# Patient Record
Sex: Male | Born: 1985
Health system: Southern US, Community
[De-identification: ages and names within clinical notes are randomized; demographics above are authoritative.]

## PROBLEM LIST (undated history)

## (undated) DIAGNOSIS — I10 Essential (primary) hypertension: Secondary | ICD-10-CM

## (undated) DIAGNOSIS — F1920 Other psychoactive substance dependence, uncomplicated: Secondary | ICD-10-CM

## (undated) DIAGNOSIS — F329 Major depressive disorder, single episode, unspecified: Secondary | ICD-10-CM

## (undated) DIAGNOSIS — B019 Varicella without complication: Secondary | ICD-10-CM

## (undated) DIAGNOSIS — F1110 Opioid abuse, uncomplicated: Secondary | ICD-10-CM

## (undated) DIAGNOSIS — Z87820 Personal history of traumatic brain injury: Secondary | ICD-10-CM

## (undated) DIAGNOSIS — F419 Anxiety disorder, unspecified: Secondary | ICD-10-CM

## (undated) DIAGNOSIS — K5792 Diverticulitis of intestine, part unspecified, without perforation or abscess without bleeding: Secondary | ICD-10-CM

## (undated) DIAGNOSIS — F32A Depression, unspecified: Secondary | ICD-10-CM

## (undated) HISTORY — DX: Depression, unspecified: F32.A

## (undated) HISTORY — DX: Varicella without complication: B01.9

## (undated) HISTORY — DX: Essential (primary) hypertension: I10

## (undated) HISTORY — DX: Other psychoactive substance dependence, uncomplicated: F19.20

## (undated) HISTORY — DX: Personal history of traumatic brain injury: Z87.820

## (undated) HISTORY — DX: Major depressive disorder, single episode, unspecified: F32.9

## (undated) HISTORY — PX: PARTIAL COLECTOMY: SHX5273

## (undated) HISTORY — DX: Anxiety disorder, unspecified: F41.9

---

## 2009-03-15 ENCOUNTER — Emergency Department (HOSPITAL_COMMUNITY): Admission: EM | Admit: 2009-03-15 | Discharge: 2009-03-15 | Payer: Self-pay | Admitting: Emergency Medicine

## 2012-01-09 ENCOUNTER — Emergency Department: Payer: Self-pay | Admitting: Emergency Medicine

## 2012-02-13 ENCOUNTER — Emergency Department: Payer: Self-pay | Admitting: Emergency Medicine

## 2012-02-13 LAB — URINALYSIS, COMPLETE
Bilirubin,UR: NEGATIVE
Glucose,UR: NEGATIVE mg/dL (ref 0–75)
Leukocyte Esterase: NEGATIVE
WBC UR: <1 /HPF (ref 0–5)

## 2012-02-13 LAB — BASIC METABOLIC PANEL
Calcium, Total: 8.9 mg/dL (ref 8.5–10.1)
Co2: 27 mmol/L (ref 21–32)
EGFR (African American): >60
EGFR (Non-African Amer.): >60
Glucose: 92 mg/dL (ref 65–99)
Osmolality: 286 (ref 275–301)
Potassium: 4.3 mmol/L (ref 3.5–5.1)
Sodium: 142 mmol/L (ref 136–145)

## 2012-02-13 LAB — CBC
HCT: 44.7 % (ref 40.0–52.0)
HGB: 15.1 g/dL (ref 13.0–18.0)
RBC: 4.85 10*6/uL (ref 4.40–5.90)
RDW: 13.7 % (ref 11.5–14.5)

## 2012-08-09 ENCOUNTER — Emergency Department: Payer: Self-pay | Admitting: Emergency Medicine

## 2012-08-09 LAB — URINALYSIS, COMPLETE
Blood: NEGATIVE
Glucose,UR: NEGATIVE mg/dL (ref 0–75)
Leukocyte Esterase: NEGATIVE
Nitrite: NEGATIVE
RBC,UR: 6 /HPF (ref 0–5)

## 2012-08-09 LAB — CBC
HCT: 43.2 % (ref 40.0–52.0)
HGB: 14.9 g/dL (ref 13.0–18.0)
MCH: 30.7 pg (ref 26.0–34.0)
MCHC: 34.5 g/dL (ref 32.0–36.0)
MCV: 89 fL (ref 80–100)
Platelet: 223 10*3/uL (ref 150–440)
RBC: 4.85 10*6/uL (ref 4.40–5.90)
RDW: 13.3 % (ref 11.5–14.5)

## 2012-08-09 LAB — COMPREHENSIVE METABOLIC PANEL
Anion Gap: 6 — ABNORMAL LOW (ref 7–16)
Chloride: 105 mmol/L (ref 98–107)
Co2: 28 mmol/L (ref 21–32)
Creatinine: 0.93 mg/dL (ref 0.60–1.30)
EGFR (Non-African Amer.): >60
Glucose: 97 mg/dL (ref 65–99)
Osmolality: 279 (ref 275–301)
SGOT(AST): 31 U/L (ref 15–37)
SGPT (ALT): 43 U/L (ref 12–78)
Sodium: 139 mmol/L (ref 136–145)

## 2012-08-09 LAB — LIPASE, BLOOD: Lipase: 53 U/L — ABNORMAL LOW (ref 73–393)

## 2013-05-19 ENCOUNTER — Emergency Department: Payer: Self-pay | Admitting: Emergency Medicine

## 2014-12-16 ENCOUNTER — Ambulatory Visit: Payer: Self-pay | Admitting: Family Medicine

## 2015-01-19 ENCOUNTER — Other Ambulatory Visit: Payer: Self-pay | Admitting: Unknown Physician Specialty

## 2015-01-19 ENCOUNTER — Other Ambulatory Visit: Payer: Self-pay

## 2015-01-19 NOTE — Telephone Encounter (Signed)
Patient was last seen on 06/17/14, practice partner number is [phone], and pharmacy is [place].

## 2015-01-19 NOTE — Telephone Encounter (Signed)
Needs seen

## 2015-01-20 ENCOUNTER — Other Ambulatory Visit: Payer: Self-pay | Admitting: Unknown Physician Specialty

## 2015-01-20 MED ORDER — GABAPENTIN 400 MG PO CAPS: 400.0000 mg | ORAL_CAPSULE | Freq: Three times a day (TID) | ORAL | Status: DC

## 2015-01-20 NOTE — Telephone Encounter (Signed)
Patient has appointment tomorrow

## 2015-01-21 ENCOUNTER — Ambulatory Visit: Payer: Self-pay | Admitting: Family Medicine

## 2015-01-21 ENCOUNTER — Encounter: Payer: Self-pay | Admitting: Unknown Physician Specialty

## 2015-01-21 ENCOUNTER — Ambulatory Visit (INDEPENDENT_AMBULATORY_CARE_PROVIDER_SITE_OTHER): Payer: Self-pay | Admitting: Unknown Physician Specialty

## 2015-01-21 VITALS — BP 152/100 | HR 101 | Temp 98.5°F | Ht 67.3 in | Wt 224.4 lb

## 2015-01-21 DIAGNOSIS — F1910 Other psychoactive substance abuse, uncomplicated: Secondary | ICD-10-CM

## 2015-01-21 MED ORDER — SERTRALINE HCL 50 MG PO TABS: 50.0000 mg | ORAL_TABLET | Freq: Every day | ORAL | Status: DC

## 2015-01-21 MED ORDER — GABAPENTIN 400 MG PO CAPS: 800.0000 mg | ORAL_CAPSULE | Freq: Three times a day (TID) | ORAL | Status: DC

## 2015-01-21 NOTE — Assessment & Plan Note (Addendum)
Problems with Gabapentin.  Will establish weekly contract with slow taper monthly.  New rx every week.  Start Zoloft daily.  Discussed pt with Dr. Sherie Don.  Will work with abuse counselor and see if they can recommend a psychiatrist

## 2015-01-21 NOTE — Progress Notes (Addendum)
   BP 152/100 mmHg  Pulse 101  Temp(Src) 98.5 F (36.9 C)  Ht 5' 7.3" (1.709 m)  Wt 224 lb 6.4 oz (101.787 kg)  BMI 34.85 kg/m2  SpO2 95%   Subjective:    Patient ID: Christopher Alvarez, male    DOB: April 05, 1986, 29 y.o.   MRN: [id number]  HPI: Christopher Alvarez is a 29 y.o. male  Chief Complaint  Patient presents with  . Medication Problem    pt states he feels like he is becoming dependent on gabapentin and wants to see about getting something else, states he is also trying to come off of trazodone   Pt is taking Gabapentin and needs a refill.  He does feel like he is addicted to it.  States if he doesn't have it, it feels like a "vice" around him.  He does have a history of addiction and is a member of AA and NA and sees the addiction specialists [facility].  States Gabapentin alters the way he feels and he wants to take more of anything that makes him feel better. He takes the Trazadone only on occasion.  He has taken Zoloft in the past and Strattera worked the best but unable to afford it.  He is wondering if Intuniv will help his lack of focus.    Relevant past medical, surgical, family and social history reviewed and updated as indicated. Interim medical history since our last visit reviewed. Allergies and medications reviewed and updated.  Review of Systems  Per HPI unless specifically indicated above     Objective:    BP 152/100 mmHg  Pulse 101  Temp(Src) 98.5 F (36.9 C)  Ht 5' 7.3" (1.709 m)  Wt 224 lb 6.4 oz (101.787 kg)  BMI 34.85 kg/m2  SpO2 95%  Wt Readings from Last 3 Encounters:  01/21/15 224 lb 6.4 oz (101.787 kg)  06/17/14 241 lb (109.317 kg)    Physical Exam  Constitutional: He is oriented to person, place, and time. He appears well-developed and well-nourished. No distress.  HENT:  Head: Normocephalic and atraumatic.  Eyes: Conjunctivae and lids are normal. Right eye exhibits no discharge. Left eye exhibits no discharge. No scleral icterus.   Cardiovascular: Normal rate and regular rhythm.   Pulmonary/Chest: Effort normal. No respiratory distress.  Abdominal: Normal appearance and bowel sounds are normal. He exhibits no distension. There is no splenomegaly or hepatomegaly. There is no tenderness.  Musculoskeletal: Normal range of motion.  Neurological: He is alert and oriented to person, place, and time.  Skin: Skin is intact. No rash noted. No pallor.  Psychiatric: He has a normal mood and affect. His behavior is normal. Judgment and thought content normal.  Vitals reviewed.    Assessment & Plan:   Problem List Items Addressed This Visit      Unprioritized   Substance abuse - Primary    Problems with Gabapentin.  Will establish weekly contract with slow taper monthly.  New rx every week.  Start Zoloft daily.  Discussed pt with Dr. Sherie Don.  Will work with abuse counselor and see if they can recommend a psychiatrist          Follow up plan: Return in about 4 weeks (around 02/18/2015).  Pt had Gabapentin in bottles that he brought over to dispose of.  Disposed of #180 pills in the sharps container witness by Berneice Heinrich.

## 2015-01-23 ENCOUNTER — Other Ambulatory Visit: Payer: Self-pay | Admitting: Unknown Physician Specialty

## 2015-01-23 MED ORDER — GABAPENTIN 400 MG PO CAPS: 800.0000 mg | ORAL_CAPSULE | Freq: Three times a day (TID) | ORAL | Status: DC

## 2015-01-26 ENCOUNTER — Telehealth: Payer: Self-pay | Admitting: Unknown Physician Specialty

## 2015-01-26 DIAGNOSIS — Z5181 Encounter for therapeutic drug level monitoring: Secondary | ICD-10-CM

## 2015-01-26 MED ORDER — METOPROLOL SUCCINATE ER 25 MG PO TB24: 25.0000 mg | ORAL_TABLET | Freq: Every day | ORAL | Status: DC

## 2015-01-26 NOTE — Telephone Encounter (Signed)
So do I need to call and schedule him a lab visit?

## 2015-01-26 NOTE — Telephone Encounter (Signed)
Not sure, however it works to get his labs drawn

## 2015-01-26 NOTE — Telephone Encounter (Signed)
Routing to provider  

## 2015-01-26 NOTE — Telephone Encounter (Signed)
Pt came in and said the zoloft doesn't seem to be working and he will schedule something with [facility] regarding that. He would like to have something called in for his blood pressure to [facility]. His BP is ranging from 160/105 to 150/100. The highest was 163/108.

## 2015-01-26 NOTE — Telephone Encounter (Signed)
Called patient to schedule lab appointment but he already has one scheduled for 01/28/15.

## 2015-01-26 NOTE — Telephone Encounter (Signed)
All are ordered.  Thanks

## 2015-01-26 NOTE — Telephone Encounter (Signed)
Pt also was told he should have a CBC and a CMP w Diff.  Wants to know if Elnita Maxwell will order these?

## 2015-01-28 ENCOUNTER — Other Ambulatory Visit: Payer: Self-pay

## 2015-01-28 DIAGNOSIS — Z5181 Encounter for therapeutic drug level monitoring: Secondary | ICD-10-CM

## 2015-01-29 ENCOUNTER — Encounter: Payer: Self-pay | Admitting: Unknown Physician Specialty

## 2015-01-29 LAB — CBC WITH DIFFERENTIAL/PLATELET
BASOS ABS: 0 10*3/uL (ref 0.0–0.2)
Basos: 0 %
EOS (ABSOLUTE): 0.1 10*3/uL (ref 0.0–0.4)
Eos: 1 %
Hematocrit: 47.9 % (ref 37.5–51.0)
Hemoglobin: 15.8 g/dL (ref 12.6–17.7)
IMMATURE GRANS (ABS): 0 10*3/uL (ref 0.0–0.1)
IMMATURE GRANULOCYTES: 0 %
LYMPHS: 30 %
Lymphocytes Absolute: 3.4 10*3/uL — ABNORMAL HIGH (ref 0.7–3.1)
MCH: 30.3 pg (ref 26.6–33.0)
MCHC: 33 g/dL (ref 31.5–35.7)
MCV: 92 fL (ref 79–97)
Monocytes Absolute: 0.6 10*3/uL (ref 0.1–0.9)
Monocytes: 6 %
NEUTROS PCT: 63 %
Neutrophils Absolute: 7.2 10*3/uL — ABNORMAL HIGH (ref 1.4–7.0)
PLATELETS: 285 10*3/uL (ref 150–379)
RBC: 5.22 x10E6/uL (ref 4.14–5.80)
RDW: 13.4 % (ref 12.3–15.4)
WBC: 11.4 10*3/uL — ABNORMAL HIGH (ref 3.4–10.8)

## 2015-01-29 LAB — COMPREHENSIVE METABOLIC PANEL
ALT: 20 IU/L (ref 0–44)
AST: 15 IU/L (ref 0–40)
Albumin/Globulin Ratio: 2 (ref 1.1–2.5)
Albumin: 4.6 g/dL (ref 3.5–5.5)
Alkaline Phosphatase: 54 IU/L (ref 39–117)
BILIRUBIN TOTAL: 0.3 mg/dL (ref 0.0–1.2)
BUN/Creatinine Ratio: 13 (ref 8–19)
BUN: 12 mg/dL (ref 6–20)
CALCIUM: 9.6 mg/dL (ref 8.7–10.2)
CHLORIDE: 100 mmol/L (ref 97–108)
CO2: 26 mmol/L (ref 18–29)
Creatinine, Ser: 0.96 mg/dL (ref 0.76–1.27)
GFR calc non Af Amer: 105 mL/min/{1.73_m2} (ref >59)
GFR, EST AFRICAN AMERICAN: 121 mL/min/{1.73_m2} (ref >59)
GLUCOSE: 92 mg/dL (ref 65–99)
Globulin, Total: 2.3 g/dL (ref 1.5–4.5)
Potassium: 4.9 mmol/L (ref 3.5–5.2)
Sodium: 142 mmol/L (ref 134–144)
TOTAL PROTEIN: 6.9 g/dL (ref 6.0–8.5)

## 2015-01-30 ENCOUNTER — Ambulatory Visit: Payer: Self-pay | Admitting: Family Medicine

## 2015-01-30 ENCOUNTER — Other Ambulatory Visit: Payer: Self-pay | Admitting: Unknown Physician Specialty

## 2015-01-30 MED ORDER — GABAPENTIN 400 MG PO CAPS: 800.0000 mg | ORAL_CAPSULE | Freq: Three times a day (TID) | ORAL | Status: DC

## 2015-02-06 ENCOUNTER — Other Ambulatory Visit: Payer: Self-pay | Admitting: Unknown Physician Specialty

## 2015-02-06 MED ORDER — GABAPENTIN 400 MG PO CAPS: 800.0000 mg | ORAL_CAPSULE | Freq: Three times a day (TID) | ORAL | Status: DC

## 2015-02-13 ENCOUNTER — Ambulatory Visit (INDEPENDENT_AMBULATORY_CARE_PROVIDER_SITE_OTHER): Payer: Self-pay | Admitting: Unknown Physician Specialty

## 2015-02-13 ENCOUNTER — Encounter: Payer: Self-pay | Admitting: Unknown Physician Specialty

## 2015-02-13 VITALS — BP 144/97 | HR 75 | Temp 98.1°F | Ht 67.5 in | Wt 230.2 lb

## 2015-02-13 DIAGNOSIS — F1910 Other psychoactive substance abuse, uncomplicated: Secondary | ICD-10-CM

## 2015-02-13 MED ORDER — GABAPENTIN 600 MG PO TABS: 600.0000 mg | ORAL_TABLET | Freq: Three times a day (TID) | ORAL | Status: DC

## 2015-02-13 NOTE — Assessment & Plan Note (Signed)
Continue taper down to 600 mg TID

## 2015-02-13 NOTE — Progress Notes (Signed)
   BP 144/97 mmHg  Pulse 75  Temp(Src) 98.1 F (36.7 C)  Ht 5' 7.5" (1.715 m)  Wt 230 lb 3.2 oz (104.418 kg)  BMI 35.50 kg/m2  SpO2 97%   Subjective:    Patient ID: Christopher Alvarez, male    DOB: 1985-09-27, 29 y.o.   MRN: [id number]  HPI: Christopher Alvarez is a 29 y.o. male  Chief Complaint  Patient presents with  . Follow-up    pt was seen 4 weeks ago for substnace abuse. States he is a recovering addict. Also states he stopped taking Zoloft because it made him feel worse.   Pt is now tapering down on Gabapentin.  He has an appointment with [facility].  He feels Gabapentin is working for mental health but needs to be on something else.   Last picked up prescription for Gabapentin yesterday morning.  I will write a prescription for 600 mg TID I will check back in 3 months.  We will continue the taper every month by one pill  Relevant past medical, surgical, family and social history reviewed and updated as indicated. Interim medical history since our last visit reviewed. Allergies and medications reviewed and updated.  Review of Systems  Per HPI unless specifically indicated above     Objective:    BP 144/97 mmHg  Pulse 75  Temp(Src) 98.1 F (36.7 C)  Ht 5' 7.5" (1.715 m)  Wt 230 lb 3.2 oz (104.418 kg)  BMI 35.50 kg/m2  SpO2 97%  Wt Readings from Last 3 Encounters:  02/13/15 230 lb 3.2 oz (104.418 kg)  01/21/15 224 lb 6.4 oz (101.787 kg)  06/17/14 241 lb (109.317 kg)    Physical Exam  Constitutional: He is oriented to person, place, and time. He appears well-developed and well-nourished. No distress.  HENT:  Head: Normocephalic and atraumatic.  Eyes: Conjunctivae and lids are normal. Right eye exhibits no discharge. Left eye exhibits no discharge. No scleral icterus.  Cardiovascular: Normal rate, regular rhythm and normal heart sounds.   Pulmonary/Chest: Effort normal and breath sounds normal. No respiratory distress.  Abdominal: Normal appearance. There is no  splenomegaly or hepatomegaly.  Musculoskeletal: Normal range of motion.  Neurological: He is alert and oriented to person, place, and time.  Skin: Skin is intact. No rash noted. No pallor.  Psychiatric: He has a normal mood and affect. His behavior is normal. Judgment and thought content normal.   Assessment & Plan:   Problem List Items Addressed This Visit      Unprioritized   Substance abuse - Primary    Continue taper down to 600 mg TID          Follow up plan: Return in about 3 months (around 05/15/2015).

## 2015-02-15 ENCOUNTER — Telehealth: Payer: Self-pay | Admitting: Family Medicine

## 2015-02-15 MED ORDER — AMLODIPINE BESYLATE 10 MG PO TABS: 10.0000 mg | ORAL_TABLET | Freq: Every day | ORAL | Status: DC

## 2015-02-15 NOTE — Telephone Encounter (Signed)
I spoke with patient after after-hours nurse talked with me about him High blood pressure; she advised urgent care, f/u with doctor in 24 hours; he refused, can't afford it, wants med called in I explained my official recommendation is that he go to urgent care, but he just can't can't he says He denies chest pain; has slight headache; no other neurologic signs/symptoms He wants medicine; add amlodipine to beta-blocker; start today, sent to [facility] F/U in office tomorrow for BP check (no charge)

## 2015-02-16 ENCOUNTER — Emergency Department: Payer: Self-pay

## 2015-02-16 ENCOUNTER — Other Ambulatory Visit: Payer: Self-pay

## 2015-02-16 ENCOUNTER — Ambulatory Visit (INDEPENDENT_AMBULATORY_CARE_PROVIDER_SITE_OTHER): Payer: Self-pay

## 2015-02-16 ENCOUNTER — Emergency Department
Admission: EM | Admit: 2015-02-16 | Discharge: 2015-02-16 | Disposition: A | Discharge status: Home / Self Care | Payer: Self-pay | Attending: Emergency Medicine | Admitting: Emergency Medicine

## 2015-02-16 ENCOUNTER — Encounter: Payer: Self-pay | Admitting: Emergency Medicine

## 2015-02-16 VITALS — BP 125/85 | HR 87

## 2015-02-16 DIAGNOSIS — Z720 Tobacco use: Secondary | ICD-10-CM | POA: Insufficient documentation

## 2015-02-16 DIAGNOSIS — R079 Chest pain, unspecified: Secondary | ICD-10-CM

## 2015-02-16 DIAGNOSIS — Z79899 Other long term (current) drug therapy: Secondary | ICD-10-CM | POA: Insufficient documentation

## 2015-02-16 DIAGNOSIS — Z0130 Encounter for examination of blood pressure without abnormal findings: Secondary | ICD-10-CM

## 2015-02-16 DIAGNOSIS — I10 Essential (primary) hypertension: Secondary | ICD-10-CM | POA: Insufficient documentation

## 2015-02-16 DIAGNOSIS — Z136 Encounter for screening for cardiovascular disorders: Secondary | ICD-10-CM

## 2015-02-16 LAB — TROPONIN I

## 2015-02-16 LAB — COMPREHENSIVE METABOLIC PANEL
ALBUMIN: 4.6 g/dL (ref 3.5–5.0)
ALT: 21 U/L (ref 17–63)
ANION GAP: 9 (ref 5–15)
AST: 23 U/L (ref 15–41)
Alkaline Phosphatase: 49 U/L (ref 38–126)
BUN: 16 mg/dL (ref 6–20)
CHLORIDE: 101 mmol/L (ref 101–111)
CO2: 28 mmol/L (ref 22–32)
Calcium: 9.4 mg/dL (ref 8.9–10.3)
Creatinine, Ser: 1.05 mg/dL (ref 0.61–1.24)
GFR calc Af Amer: >60 mL/min (ref >60)
Glucose, Bld: 95 mg/dL (ref 65–99)
POTASSIUM: 4.2 mmol/L (ref 3.5–5.1)
Sodium: 138 mmol/L (ref 135–145)
Total Bilirubin: 0.8 mg/dL (ref 0.3–1.2)
Total Protein: 7.8 g/dL (ref 6.5–8.1)

## 2015-02-16 LAB — CBC WITH DIFFERENTIAL/PLATELET
BASOS ABS: 0.1 10*3/uL (ref 0–0.1)
BASOS PCT: 1 %
EOS PCT: 1 %
Eosinophils Absolute: 0.1 10*3/uL (ref 0–0.7)
HCT: 49 % (ref 40.0–52.0)
Hemoglobin: 16.5 g/dL (ref 13.0–18.0)
Lymphocytes Relative: 22 %
Lymphs Abs: 2.9 10*3/uL (ref 1.0–3.6)
MCH: 30.5 pg (ref 26.0–34.0)
MCHC: 33.7 g/dL (ref 32.0–36.0)
MCV: 90.5 fL (ref 80.0–100.0)
MONO ABS: 1 10*3/uL (ref 0.2–1.0)
Monocytes Relative: 8 %
Neutro Abs: 9.2 10*3/uL — ABNORMAL HIGH (ref 1.4–6.5)
Neutrophils Relative %: 68 %
PLATELETS: 240 10*3/uL (ref 150–440)
RBC: 5.41 MIL/uL (ref 4.40–5.90)
RDW: 13.3 % (ref 11.5–14.5)
WBC: 13.2 10*3/uL — ABNORMAL HIGH (ref 3.8–10.6)

## 2015-02-16 LAB — FIBRIN DERIVATIVES D-DIMER (ARMC ONLY): Fibrin derivatives D-dimer (ARMC): 160 (ref 0–499)

## 2015-02-16 MED ORDER — LORAZEPAM 1 MG PO TABS
1.0000 mg | ORAL_TABLET | Freq: Once | ORAL | Status: AC
  Administered 2015-02-16: 1 mg via ORAL
  Filled 2015-02-16: qty 1

## 2015-02-16 MED ORDER — ACETAMINOPHEN 500 MG PO TABS
1000.0000 mg | ORAL_TABLET | Freq: Once | ORAL | Status: AC
  Administered 2015-02-16: 1000 mg via ORAL
  Filled 2015-02-16: qty 2

## 2015-02-16 NOTE — ED Notes (Signed)
Pt states his BP has been in the 180s SBP, pt states he was given norvasc yesterday and took his first dose last night and then his 2nd dose with metoprolol this AM, pt now states chest pain on the right side, radiating to his neck and jaw, pt denies any SOB or nausea

## 2015-02-16 NOTE — Discharge Instructions (Signed)

## 2015-02-16 NOTE — ED Provider Notes (Addendum)
[facility] Emergency Department Provider Note  ____________________________________________  Time seen: Approximately 445 PM  I have reviewed the triage vital signs and the nursing notes.   HISTORY  Chief Complaint Chest Pain    HPI Christopher Alvarez is a 29 y.o. male with a history of hypertension and anxiety who is been experiencing chest pain since 12 PM today. He says that the chest pain last anywhere from several seconds to several minutes and is changing positions on his body. He says that it will be on the left and in the right side of his chest. He says that he also feels anxious along with the chest pain. He has had elevated blood pressure over the past 2 days and just started new medications this morning. He says that his blood pressure was up into the 180s over 1 teens. He denies any chest pain at this time. No shortness of breath. Denies any nausea or vomiting. Denies any diaphoresis. No history of cardiac disease in his family. He is a smoker. No radiation of the pain.No pain with deep inspiration.   Past Medical History  Diagnosis Date  . Hypertension     There are no active problems to display for this patient.   History reviewed. No pertinent past surgical history.  Current Outpatient Rx  Name  Route  Sig  Dispense  Refill  . gabapentin (NEURONTIN) 400 MG capsule   Oral   Take 2 capsules (800 mg total) by mouth 3 (three) times daily. Take 2 capsules 3 times daily   42 capsule   0     Allergies Review of patient's allergies indicates no known allergies.  History reviewed. No pertinent family history.  Social History Social History  Substance Use Topics  . Smoking status: Current Every Day Smoker    Types: Cigarettes  . Smokeless tobacco: None  . Alcohol Use: No    Review of Systems Constitutional: No fever/chills Eyes: No visual changes. ENT: No sore throat. Cardiovascular: As above  Respiratory: Denies shortness of  breath. Gastrointestinal: No abdominal pain.  No nausea, no vomiting.  No diarrhea.  No constipation. Genitourinary: Negative for dysuria. Musculoskeletal: Negative for back pain. Skin: Negative for rash. Neurological: Negative for headaches, focal weakness or numbness.  10-point ROS otherwise negative.  ____________________________________________   PHYSICAL EXAM:  VITAL SIGNS: ED Triage Vitals  Enc Vitals Group     BP 02/16/15 1521 144/87 mmHg     Pulse Rate 02/16/15 1521 100     Resp 02/16/15 1521 20     Temp 02/16/15 1521 98.6 F (37 C)     Temp Source 02/16/15 1521 Oral     SpO2 --      Weight 02/16/15 1522 230 lb (104.327 kg)     Height 02/16/15 1521  (1.753 m)     Head Cir --      Peak Flow --      Pain Score --      Pain Loc --      Pain Edu? --      Excl. in GC? --     Constitutional: Alert and oriented. Well appearing and in no acute distress. Eyes: Conjunctivae are normal. PERRL. EOMI. Head: Atraumatic. Nose: No congestion/rhinnorhea. Mouth/Throat: Mucous membranes are moist.  Oropharynx non-erythematous. Neck: No stridor.   Cardiovascular: Normal rate, regular rhythm. Grossly normal heart sounds.  Good peripheral circulation. Chest pain is not reproducible to palpation. Respiratory: Normal respiratory effort.  No retractions. Lungs CTAB. Gastrointestinal:  Soft and nontender. No distention. No abdominal bruits. No CVA tenderness. Musculoskeletal: No lower extremity tenderness nor edema.  No joint effusions. Neurologic:  Normal speech and language. No gross focal neurologic deficits are appreciated. No gait instability. Skin:  Skin is warm, dry and intact. No rash noted. Psychiatric: Mood and affect are normal. Speech and behavior are normal.  ____________________________________________   LABS (all labs ordered are listed, but only abnormal results are displayed)  Labs Reviewed  CBC WITH DIFFERENTIAL/PLATELET - Abnormal; Notable for the following:     WBC 13.2 (*)    Neutro Abs 9.2 (*)    All other components within normal limits  COMPREHENSIVE METABOLIC PANEL  TROPONIN I  FIBRIN DERIVATIVES D-DIMER ([facility] ONLY)  TROPONIN I   ____________________________________________  EKG  ED ECG REPORT I, Arelia Longest, the attending physician, personally viewed and interpreted this ECG.   Date: 02/16/2015  EKG Time: 1519  Rate: 103  Rhythm: sinus tachycardia  Axis: Left axis deviation  Intervals:none  ST&T Change: No ST elevations or depressions. No abnormal T-wave inversions.  ____________________________________________  RADIOLOGY  No active cardiopulmonary disease on chest x-ray. I personally reviewed these images. ____________________________________________   PROCEDURES    ____________________________________________   INITIAL IMPRESSION / ASSESSMENT AND PLAN / ED COURSE  Pertinent labs & imaging results that were available during my care of the patient were reviewed by me and considered in my medical decision making (see chart for details).  ----------------------------------------- 7:31 PM on 02/16/2015 -----------------------------------------  Patient resting comfortably at this time and continues to be chest pain-free. We'll discharge to home. Will follow-up with his primary care doctor for blood pressure recheck. Says has been feeling very anxious throughout the day. AcipHex of the pain are very atypical such as the pain changing in location. Not convinced that this is cardiac related. Possible relation to the patient's anxiety. Heart score of 1. ____________________________________________   FINAL CLINICAL IMPRESSION(S) / ED DIAGNOSES  Acute chest pain. Initial visit.    Myrna Blazer, MD 02/16/15 1932  Patient not complaining of headache and increased anxiety. We'll give Tylenol and Ativan.  Myrna Blazer, MD 02/16/15 2025

## 2015-02-16 NOTE — ED Notes (Signed)
Attempted to call lab about D-Dimer results. Lab did not answer phones. Will call again soon.

## 2015-02-16 NOTE — ED Notes (Signed)
bp high x 2 wks. , cp onset today.  Skin w/d on arrival. Added new bp med yesterday

## 2015-02-16 NOTE — ED Notes (Signed)
Spoke to lab concerning D-dimer, lab states "We are working on it right now", will follow up

## 2015-02-16 NOTE — Patient Instructions (Signed)
Per Dr. Marlise Eves request, patient came in for BP check BP: 125/85 P: 87

## 2015-02-18 ENCOUNTER — Telehealth: Payer: Self-pay

## 2015-02-18 MED ORDER — METOPROLOL SUCCINATE ER 50 MG PO TB24: 50.0000 mg | ORAL_TABLET | Freq: Every day | ORAL | Status: DC

## 2015-02-18 NOTE — Telephone Encounter (Signed)
Called and spoke with patient and advised Dr. Marlise Eves recommendations as noted/advised.

## 2015-02-18 NOTE — Telephone Encounter (Signed)
Patient came into office stating that his BP is continuing to go up at night.  Patient contacted the nurse at [facility] last night and was advised to contact our office for additional medication.  Please call patient to advise next steps.

## 2015-02-18 NOTE — Telephone Encounter (Signed)
He went to the ER; he has two accounts in Epic; he has an upcoming appt already on 02/20/15 He is taking amlodipine prescribed by me over the weekend; he should be taking medicine from [place] 25 mg metoprolol He thinks he needs another medication and wondered about getting clonidine NO clonidine on beta-blocker I recommend that he increase metoprolol from 25 mg to 50 mg daily and keep appt with Elnita Maxwell Appt here sooner or urgent care if needed; to ER for any chest pain or severe headache

## 2015-02-23 ENCOUNTER — Encounter: Payer: Self-pay | Admitting: Unknown Physician Specialty

## 2015-02-23 ENCOUNTER — Ambulatory Visit (INDEPENDENT_AMBULATORY_CARE_PROVIDER_SITE_OTHER): Payer: Self-pay | Admitting: Unknown Physician Specialty

## 2015-02-23 VITALS — BP 125/81 | HR 88 | Temp 98.5°F | Ht 68.9 in | Wt 238.8 lb

## 2015-02-23 DIAGNOSIS — F419 Anxiety disorder, unspecified: Secondary | ICD-10-CM

## 2015-02-23 MED ORDER — GABAPENTIN 600 MG PO TABS: 600.0000 mg | ORAL_TABLET | Freq: Three times a day (TID) | ORAL | Status: DC

## 2015-02-23 MED ORDER — CLONAZEPAM 0.5 MG PO TABS
0.5000 mg | ORAL_TABLET | Freq: Two times a day (BID) | ORAL | Status: DC | PRN
Start: 2015-02-23 — End: 2015-04-17

## 2015-02-23 NOTE — Assessment & Plan Note (Addendum)
?   Bipolar.  Pt is self pay and unable to afford atypical anti-psychotics.  Will rx a limited dose of Clonazepam .5 mg bid prn #20 pills until he can get to [facility] for his appointment.  Will refill his Gabapentin

## 2015-02-23 NOTE — Progress Notes (Signed)
BP 125/81 mmHg  Pulse 88  Temp(Src) 98.5 F (36.9 C)  Ht 5' 8.9" (1.75 m)  Wt 238 lb 12.8 oz (108.319 kg)  BMI 35.37 kg/m2  SpO2 96%   Subjective:    Patient ID: Christopher Alvarez, male    DOB: 06-12-1985, 29 y.o.   MRN: [id number]  HPI: Christopher Alvarez is a 29 y.o. male  Chief Complaint  Patient presents with  . Hypertension    pt states blood pressure has been getting high at night, went to hospital for BP and chest pain last week  . Anxiety    pt states he cannot sit still. States he has been having anxiety attacks frequently lately   Pt is having a lot of anxiety.  He is unable to process thoughts, his BP goes up, and gets irritable quickly.  His GAD score is 19.  Zoloft makes things 10 times worse.  He admits to being diagnosed with bipolar disorder when he was 14 or 15.  Went to the ER last week which was a "bad week."  He took one pill and he did improve immediately.  He tried to call [facility] where he is an active patient but is unable to "get through."  MDQ with 5 yes answers.    Hypertension: Taking Metoprolol and Amlodipine for his BP.  He does feel at times his Amlodipine makes him "crash"  No SOB or chest pain.  He is going to try to exercise.    Relevant past medical, surgical, family and social history reviewed and updated as indicated. Interim medical history since our last visit reviewed. Allergies and medications reviewed and updated.  Review of Systems  All other systems reviewed and are negative.   Per HPI unless specifically indicated above     Objective:    BP 125/81 mmHg  Pulse 88  Temp(Src) 98.5 F (36.9 C)  Ht 5' 8.9" (1.75 m)  Wt 238 lb 12.8 oz (108.319 kg)  BMI 35.37 kg/m2  SpO2 96%  Wt Readings from Last 3 Encounters:  02/23/15 238 lb 12.8 oz (108.319 kg)  02/13/15 230 lb 3.2 oz (104.418 kg)  01/21/15 224 lb 6.4 oz (101.787 kg)    Physical Exam  Constitutional: He is oriented to person, place, and time. He appears well-developed and  well-nourished. No distress.  HENT:  Head: Normocephalic and atraumatic.  Eyes: Conjunctivae and lids are normal. Right eye exhibits no discharge. Left eye exhibits no discharge. No scleral icterus.  Cardiovascular: Normal rate, regular rhythm and normal heart sounds.   Pulmonary/Chest: Effort normal and breath sounds normal. No respiratory distress.  Abdominal: Normal appearance. He exhibits no distension. There is no splenomegaly or hepatomegaly.  Musculoskeletal: Normal range of motion.  Neurological: He is alert and oriented to person, place, and time.  Skin: Skin is warm, dry and intact. No rash noted. No pallor.  Psychiatric: He has a normal mood and affect. His behavior is normal. Judgment and thought content normal.  Nursing note and vitals reviewed.   Results for orders placed or performed in visit on 01/28/15  CBC with Differential/Platelet  Result Value Ref Range   WBC 11.4 (H) 3.4 - 10.8 x10E3/uL   RBC 5.22 4.14 - 5.80 x10E6/uL   Hemoglobin 15.8 12.6 - 17.7 g/dL   Hematocrit 14.7 82.9 - 51.0 %   MCV 92 79 - 97 fL   MCH 30.3 26.6 - 33.0 pg   MCHC 33.0 31.5 - 35.7 g/dL   RDW 13.4  12.3 - 15.4 %   Platelets 285 150 - 379 x10E3/uL   Neutrophils 63 %   Lymphs 30 %   Monocytes 6 %   Eos 1 %   Basos 0 %   Neutrophils Absolute 7.2 (H) 1.4 - 7.0 x10E3/uL   Lymphocytes Absolute 3.4 (H) 0.7 - 3.1 x10E3/uL   Monocytes Absolute 0.6 0.1 - 0.9 x10E3/uL   EOS (ABSOLUTE) 0.1 0.0 - 0.4 x10E3/uL   Basophils Absolute 0.0 0.0 - 0.2 x10E3/uL   Immature Granulocytes 0 %   Immature Grans (Abs) 0.0 0.0 - 0.1 x10E3/uL  Comprehensive metabolic panel  Result Value Ref Range   Glucose 92 65 - 99 mg/dL   BUN 12 6 - 20 mg/dL   Creatinine, Ser 1.61 0.76 - 1.27 mg/dL   GFR calc non Af Amer 105 >59 mL/min/1.73   GFR calc Af Amer 121 >59 mL/min/1.73   BUN/Creatinine Ratio 13 8 - 19   Sodium 142 134 - 144 mmol/L   Potassium 4.9 3.5 - 5.2 mmol/L   Chloride 100 97 - 108 mmol/L   CO2 26 18 - 29  mmol/L   Calcium 9.6 8.7 - 10.2 mg/dL   Total Protein 6.9 6.0 - 8.5 g/dL   Albumin 4.6 3.5 - 5.5 g/dL   Globulin, Total 2.3 1.5 - 4.5 g/dL   Albumin/Globulin Ratio 2.0 1.1 - 2.5   Bilirubin Total 0.3 0.0 - 1.2 mg/dL   Alkaline Phosphatase 54 39 - 117 IU/L   AST 15 0 - 40 IU/L   ALT 20 0 - 44 IU/L      Assessment & Plan:   Problem List Items Addressed This Visit      Unprioritized   Chronic anxiety - Primary    ? Bipolar.  Pt is self pay and unable to afford atypical anti-psychotics.  Will rx a limited dose of Clonazepam .5 mg bid prn #20 pills until he can get to [facility] for his appointment.  Will refill his Gabapentin          Follow up plan: Return in about 4 weeks (around 03/23/2015).

## 2015-02-27 ENCOUNTER — Other Ambulatory Visit: Payer: Self-pay | Admitting: Unknown Physician Specialty

## 2015-02-27 MED ORDER — GABAPENTIN 600 MG PO TABS: 600.0000 mg | ORAL_TABLET | Freq: Three times a day (TID) | ORAL | Status: DC

## 2015-03-06 ENCOUNTER — Other Ambulatory Visit: Payer: Self-pay | Admitting: Unknown Physician Specialty

## 2015-03-06 MED ORDER — GABAPENTIN 600 MG PO TABS: 600.0000 mg | ORAL_TABLET | Freq: Three times a day (TID) | ORAL | Status: DC

## 2015-03-18 ENCOUNTER — Other Ambulatory Visit: Payer: Self-pay | Admitting: Unknown Physician Specialty

## 2015-03-18 MED ORDER — GABAPENTIN 600 MG PO TABS: 600.0000 mg | ORAL_TABLET | Freq: Three times a day (TID) | ORAL | Status: DC

## 2015-03-18 MED ORDER — GABAPENTIN 400 MG PO CAPS: 800.0000 mg | ORAL_CAPSULE | Freq: Three times a day (TID) | ORAL | Status: DC

## 2015-03-20 ENCOUNTER — Other Ambulatory Visit: Payer: Self-pay | Admitting: Unknown Physician Specialty

## 2015-03-20 ENCOUNTER — Telehealth: Payer: Self-pay | Admitting: Unknown Physician Specialty

## 2015-03-20 MED ORDER — GABAPENTIN 600 MG PO TABS: 600.0000 mg | ORAL_TABLET | Freq: Three times a day (TID) | ORAL | Status: DC

## 2015-03-20 NOTE — Telephone Encounter (Signed)
Need his prescription for October 26, written for 2 weeks please.

## 2015-04-13 ENCOUNTER — Other Ambulatory Visit: Payer: Self-pay | Admitting: Unknown Physician Specialty

## 2015-04-13 MED ORDER — GABAPENTIN 600 MG PO TABS: 600.0000 mg | ORAL_TABLET | Freq: Three times a day (TID) | ORAL | Status: DC

## 2015-04-15 ENCOUNTER — Ambulatory Visit: Payer: Self-pay | Admitting: Unknown Physician Specialty

## 2015-04-17 ENCOUNTER — Encounter: Payer: Self-pay | Admitting: Unknown Physician Specialty

## 2015-04-17 ENCOUNTER — Ambulatory Visit (INDEPENDENT_AMBULATORY_CARE_PROVIDER_SITE_OTHER): Payer: Self-pay | Admitting: Unknown Physician Specialty

## 2015-04-17 VITALS — BP 128/82 | HR 63 | Temp 99.0°F | Ht 68.2 in | Wt 223.2 lb

## 2015-04-17 DIAGNOSIS — F1910 Other psychoactive substance abuse, uncomplicated: Secondary | ICD-10-CM

## 2015-04-17 DIAGNOSIS — F902 Attention-deficit hyperactivity disorder, combined type: Secondary | ICD-10-CM

## 2015-04-17 MED ORDER — ATOMOXETINE HCL 40 MG PO CAPS: 40.0000 mg | ORAL_CAPSULE | Freq: Every day | ORAL | Status: DC

## 2015-04-17 NOTE — Assessment & Plan Note (Signed)
Start Strattera at 40 mg for 3 days and increase to 80 mg.

## 2015-04-17 NOTE — Progress Notes (Signed)
BP 128/82 mmHg  Pulse 63  Temp(Src) 99 F (37.2 C)  Ht 5' 8.2" (1.732 m)  Wt 223 lb 3.2 oz (101.243 kg)  BMI 33.75 kg/m2  SpO2 96%   Subjective:    Patient ID: Christopher Alvarez, male    DOB: May 01, 1986, 29 y.o.   MRN: [id number]  HPI: Christopher Alvarez is a 29 y.o. male  Chief Complaint  Patient presents with  . Medication Problem    pt states he would like to get on straterra   Pt would like to be on Strattera that he was on in the past.  He was diagnosed with ADHD since a child.  He would like to taper his Gabapentin a little more.  Not going to counseling with [facility] but has had difficulty and is looking for another patient.  He needs to stay off controlled substances.  He lacks ability to focus.  He has trouble filtering noise   Relevant past medical, surgical, family and social history reviewed and updated as indicated. Interim medical history since our last visit reviewed. Allergies and medications reviewed and updated.  Review of Systems  Per HPI unless specifically indicated above     Objective:    BP 128/82 mmHg  Pulse 63  Temp(Src) 99 F (37.2 C)  Ht 5' 8.2" (1.732 m)  Wt 223 lb 3.2 oz (101.243 kg)  BMI 33.75 kg/m2  SpO2 96%  Wt Readings from Last 3 Encounters:  04/17/15 223 lb 3.2 oz (101.243 kg)  02/23/15 238 lb 12.8 oz (108.319 kg)  02/13/15 230 lb 3.2 oz (104.418 kg)    Physical Exam  Constitutional: He is oriented to person, place, and time. He appears well-developed and well-nourished. No distress.  HENT:  Head: Normocephalic and atraumatic.  Eyes: Conjunctivae and lids are normal. Right eye exhibits no discharge. Left eye exhibits no discharge. No scleral icterus.  Cardiovascular: Normal rate, regular rhythm and normal heart sounds.   Pulmonary/Chest: Effort normal and breath sounds normal. No respiratory distress.  Abdominal: Normal appearance and bowel sounds are normal. He exhibits no distension. There is no splenomegaly or hepatomegaly. There is  no tenderness.  Musculoskeletal: Normal range of motion.  Neurological: He is alert and oriented to person, place, and time.  Skin: Skin is intact. No rash noted. No pallor.  Psychiatric: He has a normal mood and affect. His behavior is normal. Judgment and thought content normal.    Results for orders placed or performed in visit on 01/28/15  CBC with Differential/Platelet  Result Value Ref Range   WBC 11.4 (H) 3.4 - 10.8 x10E3/uL   RBC 5.22 4.14 - 5.80 x10E6/uL   Hemoglobin 15.8 12.6 - 17.7 g/dL   Hematocrit 40.9 81.1 - 51.0 %   MCV 92 79 - 97 fL   MCH 30.3 26.6 - 33.0 pg   MCHC 33.0 31.5 - 35.7 g/dL   RDW 91.4 78.2 - 95.6 %   Platelets 285 150 - 379 x10E3/uL   Neutrophils 63 %   Lymphs 30 %   Monocytes 6 %   Eos 1 %   Basos 0 %   Neutrophils Absolute 7.2 (H) 1.4 - 7.0 x10E3/uL   Lymphocytes Absolute 3.4 (H) 0.7 - 3.1 x10E3/uL   Monocytes Absolute 0.6 0.1 - 0.9 x10E3/uL   EOS (ABSOLUTE) 0.1 0.0 - 0.4 x10E3/uL   Basophils Absolute 0.0 0.0 - 0.2 x10E3/uL   Immature Granulocytes 0 %   Immature Grans (Abs) 0.0 0.0 - 0.1 x10E3/uL  Comprehensive metabolic panel  Result Value Ref Range   Glucose 92 65 - 99 mg/dL   BUN 12 6 - 20 mg/dL   Creatinine, Ser 1.610.96 0.76 - 1.27 mg/dL   GFR calc non Af Amer 105 >59 mL/min/1.73   GFR calc Af Amer 121 >59 mL/min/1.73   BUN/Creatinine Ratio 13 8 - 19   Sodium 142 134 - 144 mmol/L   Potassium 4.9 3.5 - 5.2 mmol/L   Chloride 100 97 - 108 mmol/L   CO2 26 18 - 29 mmol/L   Calcium 9.6 8.7 - 10.2 mg/dL   Total Protein 6.9 6.0 - 8.5 g/dL   Albumin 4.6 3.5 - 5.5 g/dL   Globulin, Total 2.3 1.5 - 4.5 g/dL   Albumin/Globulin Ratio 2.0 1.1 - 2.5   Bilirubin Total 0.3 0.0 - 1.2 mg/dL   Alkaline Phosphatase 54 39 - 117 IU/L   AST 15 0 - 40 IU/L   ALT 20 0 - 44 IU/L      Assessment & Plan:   Problem List Items Addressed This Visit      Unprioritized   Substance abuse    Decrease Gabapentin to 400 mg at next refill      Attention deficit  hyperactivity disorder (ADHD), combined type - Primary    Start Strattera at 40 mg for 3 days and increase to 80 mg.            Follow up plan: Return in about 4 weeks (around 05/15/2015).

## 2015-04-17 NOTE — Assessment & Plan Note (Signed)
Decrease Gabapentin to 400 mg at next refill

## 2015-04-24 ENCOUNTER — Other Ambulatory Visit: Payer: Self-pay | Admitting: Unknown Physician Specialty

## 2015-04-24 MED ORDER — GABAPENTIN 600 MG PO TABS: 600.0000 mg | ORAL_TABLET | Freq: Three times a day (TID) | ORAL | Status: DC

## 2015-04-28 ENCOUNTER — Other Ambulatory Visit: Payer: Self-pay | Admitting: Unknown Physician Specialty

## 2015-04-28 MED ORDER — GABAPENTIN 400 MG PO CAPS: 800.0000 mg | ORAL_CAPSULE | Freq: Three times a day (TID) | ORAL | Status: DC

## 2015-05-06 NOTE — Telephone Encounter (Signed)
done

## 2015-05-15 ENCOUNTER — Ambulatory Visit: Payer: Self-pay | Admitting: Unknown Physician Specialty

## 2015-05-18 ENCOUNTER — Other Ambulatory Visit: Payer: Self-pay | Admitting: Unknown Physician Specialty

## 2015-05-18 MED ORDER — GABAPENTIN 400 MG PO CAPS: 800.0000 mg | ORAL_CAPSULE | Freq: Three times a day (TID) | ORAL | Status: DC

## 2015-05-27 ENCOUNTER — Other Ambulatory Visit: Payer: Self-pay

## 2015-05-27 DIAGNOSIS — Z5181 Encounter for therapeutic drug level monitoring: Secondary | ICD-10-CM

## 2015-05-28 ENCOUNTER — Encounter: Payer: Self-pay | Admitting: Family Medicine

## 2015-05-28 LAB — URINE DRUGS OF ABUSE SCREEN W ALC, ROUTINE (REF LAB)
Amphetamines, Urine: NEGATIVE ng/mL
BARBITURATE QUANT UR: NEGATIVE ng/mL
BENZODIAZEPINE QUANT UR: NEGATIVE ng/mL
COCAINE (METAB.): NEGATIVE ng/mL
Cannabinoid Quant, Ur: NEGATIVE ng/mL
Ethanol U, Quan: NEGATIVE %
Methadone Screen, Urine: NEGATIVE ng/mL
OPIATE QUANT UR: NEGATIVE ng/mL
PCP QUANT UR: NEGATIVE ng/mL
Propoxyphene: NEGATIVE ng/mL

## 2015-06-05 ENCOUNTER — Other Ambulatory Visit: Payer: Self-pay | Admitting: Unknown Physician Specialty

## 2015-06-05 ENCOUNTER — Ambulatory Visit: Payer: Self-pay | Admitting: Unknown Physician Specialty

## 2015-06-05 MED ORDER — GABAPENTIN 600 MG PO TABS: 600.0000 mg | ORAL_TABLET | Freq: Three times a day (TID) | ORAL | Status: DC

## 2015-06-19 ENCOUNTER — Other Ambulatory Visit: Payer: Self-pay | Admitting: Unknown Physician Specialty

## 2015-06-19 ENCOUNTER — Ambulatory Visit (INDEPENDENT_AMBULATORY_CARE_PROVIDER_SITE_OTHER): Payer: Self-pay | Admitting: Unknown Physician Specialty

## 2015-06-19 ENCOUNTER — Encounter: Payer: Self-pay | Admitting: Unknown Physician Specialty

## 2015-06-19 VITALS — BP 129/83 | HR 87 | Temp 98.3°F | Ht 67.5 in | Wt 217.2 lb

## 2015-06-19 DIAGNOSIS — F419 Anxiety disorder, unspecified: Secondary | ICD-10-CM

## 2015-06-19 MED ORDER — GABAPENTIN 400 MG PO CAPS: 400.0000 mg | ORAL_CAPSULE | Freq: Three times a day (TID) | ORAL | Status: DC

## 2015-06-19 MED ORDER — GABAPENTIN 400 MG PO CAPS: 800.0000 mg | ORAL_CAPSULE | Freq: Three times a day (TID) | ORAL | Status: DC

## 2015-06-19 MED ORDER — CLONAZEPAM 1 MG PO TABS: 1.0000 mg | ORAL_TABLET | Freq: Two times a day (BID) | ORAL | Status: DC | PRN

## 2015-06-19 NOTE — Assessment & Plan Note (Signed)
Pt trying to get off Gabapentin..  Will cut back to 400 mg TID and start Clonazepam 1 mg twice a day until off Gabapentin.  Will then taper Clonazepam.

## 2015-06-19 NOTE — Progress Notes (Signed)
   BP 129/83 mmHg  Pulse 87  Temp(Src) 98.3 F (36.8 C)  Ht 5' 7.5" (1.715 m)  Wt 217 lb 3.2 oz (98.521 kg)  BMI 33.50 kg/m2  SpO2 95%   Subjective:    Patient ID: Christopher Alvarez, male    DOB: 01-16-1986, 30 y.o.   MRN: [id number]  HPI: Christopher Alvarez is a 30 y.o. male  Chief Complaint  Patient presents with  . Anxiety  . Hypertension   Anxiety is a problem.  Gabapentin not working but if he tries to cut down on the Gabapentin the anxiety is a problem.  He is waiting for Strattera.  He has no insurance but is getting it.  He would still like to cut out the Gabapentin. In the past he has been able to take Clonazepam and then taper off that.       Relevant past medical, surgical, family and social history reviewed and updated as indicated. Interim medical history since our last visit reviewed. Allergies and medications reviewed and updated.  Review of Systems  Per HPI unless specifically indicated above     Objective:    BP 129/83 mmHg  Pulse 87  Temp(Src) 98.3 F (36.8 C)  Ht 5' 7.5" (1.715 m)  Wt 217 lb 3.2 oz (98.521 kg)  BMI 33.50 kg/m2  SpO2 95%  Wt Readings from Last 3 Encounters:  06/19/15 217 lb 3.2 oz (98.521 kg)  04/17/15 223 lb 3.2 oz (101.243 kg)  02/23/15 238 lb 12.8 oz (108.319 kg)    Physical Exam  Constitutional: He is oriented to person, place, and time. He appears well-developed and well-nourished. No distress.  HENT:  Head: Normocephalic and atraumatic.  Eyes: Conjunctivae and lids are normal. Right eye exhibits no discharge. Left eye exhibits no discharge. No scleral icterus.  Cardiovascular: Normal rate.   Pulmonary/Chest: Effort normal.  Abdominal: Normal appearance. There is no splenomegaly or hepatomegaly.  Musculoskeletal: Normal range of motion.  Neurological: He is alert and oriented to person, place, and time.  Skin: Skin is intact. No rash noted. No pallor.  Psychiatric: He has a normal mood and affect. His behavior is normal.  Judgment and thought content normal.    Results for orders placed or performed in visit on 05/27/15  Drugs of abuse scrn w alc, routine urine  Result Value Ref Range   Amphetamines, Urine Negative Cutoff=1000 ng/mL   Barbiturate Quant, Ur Negative Cutoff=300 ng/mL   Benzodiazepine Quant, Ur Negative Cutoff=300 ng/mL   Cannabinoid Quant, Ur Negative Cutoff=50 ng/mL   Cocaine (Metab.) Negative Cutoff=300 ng/mL   Opiate Quant, Ur Negative Cutoff=300 ng/mL   PCP Quant, Ur Negative Cutoff=25 ng/mL   Methadone Screen, Urine Negative Cutoff=300 ng/mL   Propoxyphene Negative Cutoff=300 ng/mL   Ethanol U, Quan Negative Cutoff=0.020 %      Assessment & Plan:   Problem List Items Addressed This Visit      Unprioritized   Chronic anxiety - Primary    Pt trying to get off Gabapentin..  Will cut back to 400 mg TID and start Clonazepam 1 mg twice a day until off Gabapentin.  Will then taper Clonazepam.            Follow up plan: Return in about 4 weeks (around 07/17/2015).

## 2015-06-24 ENCOUNTER — Other Ambulatory Visit: Payer: Self-pay | Admitting: Unknown Physician Specialty

## 2015-06-24 MED ORDER — ATOMOXETINE HCL 40 MG PO CAPS: 40.0000 mg | ORAL_CAPSULE | Freq: Every day | ORAL | Status: DC

## 2015-06-28 ENCOUNTER — Emergency Department: Payer: Self-pay

## 2015-06-28 ENCOUNTER — Encounter: Payer: Self-pay | Admitting: Emergency Medicine

## 2015-06-28 ENCOUNTER — Emergency Department
Admission: EM | Admit: 2015-06-28 | Discharge: 2015-06-28 | Disposition: A | Discharge status: Home / Self Care | Payer: Self-pay | Attending: Emergency Medicine | Admitting: Emergency Medicine

## 2015-06-28 DIAGNOSIS — K5732 Diverticulitis of large intestine without perforation or abscess without bleeding: Secondary | ICD-10-CM | POA: Insufficient documentation

## 2015-06-28 DIAGNOSIS — I10 Essential (primary) hypertension: Secondary | ICD-10-CM | POA: Insufficient documentation

## 2015-06-28 DIAGNOSIS — R1032 Left lower quadrant pain: Secondary | ICD-10-CM

## 2015-06-28 DIAGNOSIS — F17210 Nicotine dependence, cigarettes, uncomplicated: Secondary | ICD-10-CM | POA: Insufficient documentation

## 2015-06-28 DIAGNOSIS — Z79899 Other long term (current) drug therapy: Secondary | ICD-10-CM | POA: Insufficient documentation

## 2015-06-28 LAB — COMPREHENSIVE METABOLIC PANEL
ALK PHOS: 57 U/L (ref 38–126)
ALT: 14 U/L — AB (ref 17–63)
AST: 15 U/L (ref 15–41)
Albumin: 4.3 g/dL (ref 3.5–5.0)
Anion gap: 7 (ref 5–15)
BUN: 7 mg/dL (ref 6–20)
CALCIUM: 9.6 mg/dL (ref 8.9–10.3)
CHLORIDE: 103 mmol/L (ref 101–111)
CO2: 29 mmol/L (ref 22–32)
CREATININE: 0.89 mg/dL (ref 0.61–1.24)
GFR calc non Af Amer: >60 mL/min (ref >60)
GLUCOSE: 119 mg/dL — AB (ref 65–99)
Potassium: 3.6 mmol/L (ref 3.5–5.1)
SODIUM: 139 mmol/L (ref 135–145)
Total Bilirubin: 0.7 mg/dL (ref 0.3–1.2)
Total Protein: 8.1 g/dL (ref 6.5–8.1)

## 2015-06-28 LAB — URINALYSIS COMPLETE WITH MICROSCOPIC (ARMC ONLY)
BACTERIA UA: NONE SEEN
BILIRUBIN URINE: NEGATIVE
Glucose, UA: NEGATIVE mg/dL
HGB URINE DIPSTICK: NEGATIVE
Ketones, ur: NEGATIVE mg/dL
LEUKOCYTES UA: NEGATIVE
NITRITE: NEGATIVE
PROTEIN: NEGATIVE mg/dL
SPECIFIC GRAVITY, URINE: 1.005 (ref 1.005–1.030)
Squamous Epithelial / LPF: NONE SEEN
pH: 7 (ref 5.0–8.0)

## 2015-06-28 LAB — CBC
HCT: 46.5 % (ref 40.0–52.0)
Hemoglobin: 15.9 g/dL (ref 13.0–18.0)
MCH: 30.1 pg (ref 26.0–34.0)
MCHC: 34.2 g/dL (ref 32.0–36.0)
MCV: 88.1 fL (ref 80.0–100.0)
PLATELETS: 289 10*3/uL (ref 150–440)
RBC: 5.28 MIL/uL (ref 4.40–5.90)
RDW: 13.8 % (ref 11.5–14.5)
WBC: 12.8 10*3/uL — ABNORMAL HIGH (ref 3.8–10.6)

## 2015-06-28 MED ORDER — IOHEXOL 240 MG/ML SOLN
25.0000 mL | Freq: Once | INTRAMUSCULAR | Status: AC | PRN
  Administered 2015-06-28: 25 mL via ORAL
  Filled 2015-06-28: qty 25

## 2015-06-28 MED ORDER — OXYCODONE HCL 5 MG PO TABS: 5.0000 mg | ORAL_TABLET | ORAL | Status: DC | PRN

## 2015-06-28 MED ORDER — CIPROFLOXACIN HCL 500 MG PO TABS: 500.0000 mg | ORAL_TABLET | Freq: Two times a day (BID) | ORAL | Status: AC

## 2015-06-28 MED ORDER — IOHEXOL 300 MG/ML  SOLN
100.0000 mL | Freq: Once | INTRAMUSCULAR | Status: AC | PRN
  Administered 2015-06-28: 100 mL via INTRAVENOUS
  Filled 2015-06-28: qty 100

## 2015-06-28 MED ORDER — METRONIDAZOLE 500 MG PO TABS: 500.0000 mg | ORAL_TABLET | Freq: Two times a day (BID) | ORAL | Status: DC

## 2015-06-28 MED ORDER — ONDANSETRON HCL 4 MG/2ML IJ SOLN
4.0000 mg | Freq: Once | INTRAMUSCULAR | Status: AC
  Administered 2015-06-28: 4 mg via INTRAVENOUS
  Filled 2015-06-28: qty 2

## 2015-06-28 MED ORDER — HYDROMORPHONE HCL 1 MG/ML IJ SOLN
0.5000 mg | INTRAMUSCULAR | Status: DC | PRN
  Administered 2015-06-28: 0.5 mg via INTRAVENOUS
  Filled 2015-06-28: qty 1

## 2015-06-28 MED ORDER — SODIUM CHLORIDE 0.9 % IV BOLUS (SEPSIS)
1000.0000 mL | Freq: Once | INTRAVENOUS | Status: AC
  Administered 2015-06-28: 1000 mL via INTRAVENOUS

## 2015-06-28 NOTE — ED Notes (Signed)
Pressure with sitting, worse when he stands up.

## 2015-06-28 NOTE — ED Notes (Signed)
Patient transported to CT 

## 2015-06-28 NOTE — ED Notes (Signed)
Also states constipated for 4 days

## 2015-06-28 NOTE — ED Provider Notes (Signed)
[facility] Emergency Department Provider Note  ____________________________________________  Time seen: 38  I have reviewed the triage vital signs and the nursing notes.  History by:  Patient  HISTORY  Chief Complaint Abdominal Pain  pain, left lower quadrant    HPI Christopher Alvarez is a 30 y.o. male who reports he has had abdominal pain for proximally 4 days. This is in the left lower quadrant. He reports he had diarrhea prior to the onset of this, but since then he has not had a bowel movement. He reports the pain has been fairly severe. He reports that it radiates to his left lower back. It is limited his ability to get out of bed in the morning. He reports he has a day to one hour to get out of bed. He took his nighttime medicines to help him (clonazepam and gabapentin). He denies any nausea or vomiting.     Past Medical History  Diagnosis Date  . Anxiety   . Drug addiction (HCC)   . Hypertension   . History of concussion     Patient Active Problem List   Diagnosis Date Noted  . Attention deficit hyperactivity disorder (ADHD), combined type 04/17/2015  . Chronic anxiety 02/23/2015  . Substance abuse 01/21/2015    History reviewed. No pertinent past surgical history.  Current Outpatient Rx  Name  Route  Sig  Dispense  Refill  . atomoxetine (STRATTERA) 40 MG capsule   Oral   Take 1 capsule (40 mg total) by mouth daily. Start with 1 QD for 3 days and then take 2 QD. Patient not taking: Reported on 06/19/2015   60 capsule   1   . ciprofloxacin (CIPRO) 500 MG tablet   Oral   Take 1 tablet (500 mg total) by mouth 2 (two) times daily.   28 tablet   0   . clonazePAM (KLONOPIN) 1 MG tablet   Oral   Take 1 tablet (1 mg total) by mouth 2 (two) times daily as needed for anxiety.   56 tablet   0   . gabapentin (NEURONTIN) 400 MG capsule   Oral   Take 1 capsule (400 mg total) by mouth 3 (three) times daily.   84 capsule   0   .  Melatonin 3 MG TABS   Oral   Take by mouth. Takes 2 to 3 tablets per night         . metoprolol succinate (TOPROL-XL) 50 MG 24 hr tablet   Oral   Take 1 tablet (50 mg total) by mouth daily. Take with or immediately following a meal.   30 tablet   0     New higher dose   . metroNIDAZOLE (FLAGYL) 500 MG tablet   Oral   Take 1 tablet (500 mg total) by mouth 2 (two) times daily.   14 tablet   0     Allergies Review of patient's allergies indicates no known allergies.  Family History  Problem Relation Age of Onset  . Lung cancer Paternal Grandfather   . Diabetes Paternal Grandfather   . Diabetes Father   . Thyroid disease Mother   . Alzheimer's disease Maternal Grandfather   . Cancer Paternal Grandmother     breast  . Dementia Paternal Grandmother     Social History Social History  Substance Use Topics  . Smoking status: Current Every Day Smoker -- 1.00 packs/day    Types: E-cigarettes  . Smokeless tobacco: Former Neurosurgeon  . Alcohol  Use: No    Review of Systems  Constitutional: Negative for fever/chills. ENT: Negative for congestion. Cardiovascular: Negative for chest pain. Respiratory: Negative for cough. Gastrointestinal: Abdominal pain, left lower quadrant Genitourinary: Negative for dysuria. Musculoskeletal: No back pain. Skin: Negative for rash. Neurological: Negative for headache or focal weakness   10-point ROS otherwise negative.  ____________________________________________   PHYSICAL EXAM:  VITAL SIGNS: ED Triage Vitals  Enc Vitals Group     BP 06/28/15 1622 145/95 mmHg     Pulse Rate 06/28/15 1622 119     Resp 06/28/15 1622 18     Temp 06/28/15 1622 98 F (36.7 C)     Temp Source 06/28/15 1622 Oral     SpO2 06/28/15 1622 97 %     Weight --      Height --      Head Cir --      Peak Flow --      Pain Score --      Pain Loc --      Pain Edu? --      Excl. in GC? --     Constitutional: Alert and oriented. Appears uncomfortable but  no acute distress. ENT   Head: Normocephalic and atraumatic.   Nose: No congestion/rhinnorhea.       Mouth: No erythema, no swelling   Cardiovascular: Normal rate, regular rhythm, no murmur noted Respiratory:  Normal respiratory effort, no tachypnea.    Breath sounds are clear and equal bilaterally.  Gastrointestinal: Soft, no distention. Exquisite tenderness in the left lower quadrant. Back: No muscle spasm, no tenderness, no CVA tenderness. Musculoskeletal: No deformity noted. Nontender with normal range of motion in all extremities.  No noted edema. Neurologic:  Communicative. Normal appearing spontaneous movement in all 4 extremities. No gross focal neurologic deficits are appreciated.  Skin:  Skin is warm, dry. No rash noted. Psychiatric: Mood and affect are normal. Speech and behavior are normal.  ____________________________________________    LABS (pertinent positives/negatives)  Labs Reviewed  COMPREHENSIVE METABOLIC PANEL - Abnormal; Notable for the following:    Glucose, Bld 119 (*)    ALT 14 (*)    All other components within normal limits  CBC - Abnormal; Notable for the following:    WBC 12.8 (*)    All other components within normal limits  URINALYSIS COMPLETEWITH MICROSCOPIC ([facility] ONLY) - Abnormal; Notable for the following:    Color, Urine STRAW (*)    APPearance CLEAR (*)    All other components within normal limits     ____________________________________________   RADIOLOGY  CT abdomen and pelvis  IMPRESSION: Diverticulosis of the sigmoid colon with inflammatory changes around the low descending and proximal sigmoid colon consistent with acute diverticulitis. No abscess. ____________________________________________   PROCEDURES    ____________________________________________   INITIAL IMPRESSION / ASSESSMENT AND PLAN / ED COURSE  Pertinent labs & imaging results that were available during my care of the patient were reviewed by me  and considered in my medical decision making (see chart for details).  Abdominal pain worrisome for diverticulitis or colitis. We will perform a CT scan of the abdomen and pelvis.  ----------------------------------------- 7:37 PM on 06/28/2015 -----------------------------------------  CT scan shows diverticulosis of the sigmoid colon with inflammatory changes. This is consistent with his clinical picture.  At this time, the patient is reporting he is a family emergency and is requesting immediate discharge. We will write him prescriptions for ciprofloxacin and Flagyl and allow him to leave and follow-up with an  outpatient physician.  ____________________________________________   FINAL CLINICAL IMPRESSION(S) / ED DIAGNOSES  Final diagnoses:  Diverticulitis of large intestine without perforation or abscess without bleeding  Left lower quadrant pain      Darien Ramus, MD 06/28/15 1940

## 2015-06-28 NOTE — Discharge Instructions (Signed)
You have inflammatory changes in your distal large colon consistent with diverticulitis. Take Cipro and Flagyl. Follow-up with your regular doctor. Return to emergency department if you have fever, worsening pain, or other urgent concerns.  Diverticulitis Diverticulitis is inflammation or infection of small pouches in your colon that form when you have a condition called diverticulosis. The pouches in your colon are called diverticula. Your colon, or large intestine, is where water is absorbed and stool is formed. Complications of diverticulitis can include:  Bleeding.  Severe infection.  Severe pain.  Perforation of your colon.  Obstruction of your colon. CAUSES  Diverticulitis is caused by bacteria. Diverticulitis happens when stool becomes trapped in diverticula. This allows bacteria to grow in the diverticula, which can lead to inflammation and infection. RISK FACTORS People with diverticulosis are at risk for diverticulitis. Eating a diet that does not include enough fiber from fruits and vegetables may make diverticulitis more likely to develop. SYMPTOMS  Symptoms of diverticulitis may include:  Abdominal pain and tenderness. The pain is normally located on the left side of the abdomen, but may occur in other areas.  Fever and chills.  Bloating.  Cramping.  Nausea.  Vomiting.  Constipation.  Diarrhea.  Blood in your stool. DIAGNOSIS  Your health care provider will ask you about your medical history and do a physical exam. You may need to have tests done because many medical conditions can cause the same symptoms as diverticulitis. Tests may include:  Blood tests.  Urine tests.  Imaging tests of the abdomen, including X-rays and CT scans. When your condition is under control, your health care provider may recommend that you have a colonoscopy. A colonoscopy can show how severe your diverticula are and whether something else is causing your symptoms. TREATMENT    Most cases of diverticulitis are mild and can be treated at home. Treatment may include:  Taking over-the-counter pain medicines.  Following a clear liquid diet.  Taking antibiotic medicines by mouth for 7-10 days. More severe cases may be treated at a hospital. Treatment may include:  Not eating or drinking.  Taking prescription pain medicine.  Receiving antibiotic medicines through an IV tube.  Receiving fluids and nutrition through an IV tube.  Surgery. HOME CARE INSTRUCTIONS   Follow your health care provider's instructions carefully.  Follow a full liquid diet or other diet as directed by your health care provider. After your symptoms improve, your health care provider may tell you to change your diet. He or she may recommend you eat a high-fiber diet. Fruits and vegetables are good sources of fiber. Fiber makes it easier to pass stool.  Take fiber supplements or probiotics as directed by your health care provider.  Only take medicines as directed by your health care provider.  Keep all your follow-up appointments. SEEK MEDICAL CARE IF:   Your pain does not improve.  You have a hard time eating food.  Your bowel movements do not return to normal. SEEK IMMEDIATE MEDICAL CARE IF:   Your pain becomes worse.  Your symptoms do not get better.  Your symptoms suddenly get worse.  You have a fever.  You have repeated vomiting.  You have bloody or black, tarry stools. MAKE SURE YOU:   Understand these instructions.  Will watch your condition.  Will get help right away if you are not doing well or get worse.   This information is not intended to replace advice given to you by your health care provider. Make sure you  discuss any questions you have with your health care provider.   Document Released: 03/02/2005 Document Revised: 05/28/2013 Document Reviewed: 04/17/2013 Elsevier Interactive Patient Education [employer]

## 2015-07-17 ENCOUNTER — Other Ambulatory Visit: Payer: Self-pay | Admitting: Unknown Physician Specialty

## 2015-07-17 MED ORDER — GABAPENTIN 400 MG PO CAPS: 800.0000 mg | ORAL_CAPSULE | Freq: Three times a day (TID) | ORAL | Status: DC

## 2015-07-22 ENCOUNTER — Ambulatory Visit: Payer: Self-pay | Admitting: Unknown Physician Specialty

## 2015-07-23 ENCOUNTER — Other Ambulatory Visit: Payer: Self-pay | Admitting: Unknown Physician Specialty

## 2015-08-10 ENCOUNTER — Ambulatory Visit: Payer: Self-pay | Admitting: Unknown Physician Specialty

## 2015-08-10 ENCOUNTER — Encounter: Payer: Self-pay | Admitting: Unknown Physician Specialty

## 2015-10-02 ENCOUNTER — Other Ambulatory Visit: Payer: Self-pay

## 2015-10-02 MED ORDER — METOPROLOL SUCCINATE ER 50 MG PO TB24: 50.0000 mg | ORAL_TABLET | Freq: Every day | ORAL | Status: DC

## 2015-10-02 NOTE — Telephone Encounter (Signed)
Called patient because we got a fax from [facility] about refilling his strattera. Patient stated he has not been taking this medication anymore. He wanted to schedule a follow-up visit so I scheduled him an appointment for 10/28/15. Patient stated he needs a refill on metoprolol.

## 2015-10-28 ENCOUNTER — Ambulatory Visit: Payer: Self-pay | Admitting: Unknown Physician Specialty

## 2016-01-26 ENCOUNTER — Encounter: Payer: Self-pay | Admitting: Emergency Medicine

## 2016-01-26 ENCOUNTER — Emergency Department
Admission: EM | Admit: 2016-01-26 | Discharge: 2016-01-26 | Disposition: A | Discharge status: Home / Self Care | Payer: Self-pay | Attending: Emergency Medicine | Admitting: Emergency Medicine

## 2016-01-26 ENCOUNTER — Emergency Department: Payer: Self-pay

## 2016-01-26 DIAGNOSIS — K5732 Diverticulitis of large intestine without perforation or abscess without bleeding: Secondary | ICD-10-CM | POA: Insufficient documentation

## 2016-01-26 DIAGNOSIS — I10 Essential (primary) hypertension: Secondary | ICD-10-CM | POA: Insufficient documentation

## 2016-01-26 DIAGNOSIS — F17210 Nicotine dependence, cigarettes, uncomplicated: Secondary | ICD-10-CM | POA: Insufficient documentation

## 2016-01-26 HISTORY — DX: Diverticulitis of intestine, part unspecified, without perforation or abscess without bleeding: K57.92

## 2016-01-26 LAB — CBC
HCT: 43.4 % (ref 40.0–52.0)
Hemoglobin: 15.2 g/dL (ref 13.0–18.0)
MCH: 31.2 pg (ref 26.0–34.0)
MCHC: 35.1 g/dL (ref 32.0–36.0)
MCV: 88.8 fL (ref 80.0–100.0)
PLATELETS: 224 10*3/uL (ref 150–440)
RBC: 4.89 MIL/uL (ref 4.40–5.90)
RDW: 13.3 % (ref 11.5–14.5)
WBC: 17.3 10*3/uL — ABNORMAL HIGH (ref 3.8–10.6)

## 2016-01-26 LAB — COMPREHENSIVE METABOLIC PANEL
ALBUMIN: 4.4 g/dL (ref 3.5–5.0)
ALK PHOS: 54 U/L (ref 38–126)
ALT: 29 U/L (ref 17–63)
AST: 25 U/L (ref 15–41)
Anion gap: 8 (ref 5–15)
BUN: 16 mg/dL (ref 6–20)
CALCIUM: 9.1 mg/dL (ref 8.9–10.3)
CHLORIDE: 106 mmol/L (ref 101–111)
CO2: 25 mmol/L (ref 22–32)
CREATININE: 0.88 mg/dL (ref 0.61–1.24)
GFR calc non Af Amer: >60 mL/min (ref >60)
GLUCOSE: 107 mg/dL — AB (ref 65–99)
Potassium: 3.6 mmol/L (ref 3.5–5.1)
SODIUM: 139 mmol/L (ref 135–145)
Total Bilirubin: 0.6 mg/dL (ref 0.3–1.2)
Total Protein: 7.1 g/dL (ref 6.5–8.1)

## 2016-01-26 LAB — URINALYSIS COMPLETE WITH MICROSCOPIC (ARMC ONLY)
BILIRUBIN URINE: NEGATIVE
Bacteria, UA: NONE SEEN
GLUCOSE, UA: NEGATIVE mg/dL
Hgb urine dipstick: NEGATIVE
KETONES UR: NEGATIVE mg/dL
Leukocytes, UA: NEGATIVE
Nitrite: NEGATIVE
Protein, ur: NEGATIVE mg/dL
SPECIFIC GRAVITY, URINE: 1.018 (ref 1.005–1.030)
Squamous Epithelial / LPF: NONE SEEN
pH: 6 (ref 5.0–8.0)

## 2016-01-26 LAB — LIPASE, BLOOD: LIPASE: 16 U/L (ref 11–51)

## 2016-01-26 MED ORDER — CIPROFLOXACIN HCL 500 MG PO TABS: 500.0000 mg | ORAL_TABLET | Freq: Two times a day (BID) | ORAL | 0 refills | Status: AC

## 2016-01-26 MED ORDER — LEVOFLOXACIN 500 MG PO TABS
500.0000 mg | ORAL_TABLET | Freq: Once | ORAL | Status: AC
  Administered 2016-01-26: 500 mg via ORAL
  Filled 2016-01-26: qty 1

## 2016-01-26 MED ORDER — SODIUM CHLORIDE 0.9 % IV BOLUS (SEPSIS)
500.0000 mL | Freq: Once | INTRAVENOUS | Status: AC
  Administered 2016-01-26: 500 mL via INTRAVENOUS

## 2016-01-26 MED ORDER — IOPAMIDOL (ISOVUE-300) INJECTION 61%
100.0000 mL | Freq: Once | INTRAVENOUS | Status: AC | PRN
  Administered 2016-01-26: 100 mL via INTRAVENOUS
  Filled 2016-01-26: qty 100

## 2016-01-26 MED ORDER — HYDROMORPHONE HCL 1 MG/ML IJ SOLN
0.5000 mg | Freq: Once | INTRAMUSCULAR | Status: AC
  Administered 2016-01-26: 0.5 mg via INTRAVENOUS
  Filled 2016-01-26: qty 1

## 2016-01-26 MED ORDER — METRONIDAZOLE 500 MG PO TABS: 500.0000 mg | ORAL_TABLET | Freq: Three times a day (TID) | ORAL | 0 refills | Status: AC

## 2016-01-26 MED ORDER — METRONIDAZOLE 500 MG PO TABS
500.0000 mg | ORAL_TABLET | Freq: Once | ORAL | Status: AC
  Administered 2016-01-26: 500 mg via ORAL
  Filled 2016-01-26: qty 1

## 2016-01-26 MED ORDER — DIATRIZOATE MEGLUMINE & SODIUM 66-10 % PO SOLN
15.0000 mL | Freq: Once | ORAL | Status: AC
  Administered 2016-01-26: 15 mL via ORAL

## 2016-01-26 MED ORDER — KETOROLAC TROMETHAMINE 30 MG/ML IJ SOLN
15.0000 mg | Freq: Once | INTRAMUSCULAR | Status: AC
  Administered 2016-01-26: 15 mg via INTRAVENOUS
  Filled 2016-01-26: qty 1

## 2016-01-26 MED ORDER — ONDANSETRON HCL 4 MG/2ML IJ SOLN
4.0000 mg | Freq: Once | INTRAMUSCULAR | Status: AC
  Administered 2016-01-26: 4 mg via INTRAVENOUS
  Filled 2016-01-26: qty 2

## 2016-01-26 NOTE — ED Triage Notes (Signed)
Pt reports lower abdominal pain with loose stools x3 days. Pt reports small amount of blood on toilet paper this morning. Pt with hx of diverticulitis.

## 2016-01-26 NOTE — Discharge Instructions (Signed)
Please return immediately if condition worsens. Please contact her primary physician or the physician you were given for referral. If you have any specialist physicians involved in her treatment and plan please also contact them. Thank you for using [facility]. ° °

## 2016-01-26 NOTE — ED Provider Notes (Signed)
Time Seen: Approximately 1808  I have reviewed the triage notes  Chief Complaint: Abdominal Pain   History of Present Illness: Christopher Alvarez is a 30 y.o. male who presents with left lower quadrant abdominal pain. Increasing over the last 3 days Patient states he's had a history of diverticulitis several months ago. He states the pain seems to be the same characteristic just more intense. Patient states that the corrected on antibiotics. He has not followed up with a gastroenterologist at this point. He noticed a small amount of blood when he wiped on the tissue but otherwise denies any significant melena or hematochezia. He is not aware of any fever at home and denies any testicular or flank pain. He has had some loose stool and bowel urgency.   Past Medical History:  Diagnosis Date  . Diverticulitis   . Hypertension     There are no active problems to display for this patient.   History reviewed. No pertinent surgical history.  History reviewed. No pertinent surgical history.  Current Outpatient Rx  . Order #: [id number] Class: No Print  . Order #: [id number] Class: Print  . Order #: [id number] Class: Print  . Order #: [id number] Class: Normal  . Order #: [id number] Class: Print    Allergies:  Review of patient's allergies indicates no known allergies.  Family History: No family history on file.  Social History: Social History  Substance Use Topics  . Smoking status: Current Every Day Smoker    Types: Cigarettes  . Smokeless tobacco: Never Used  . Alcohol use No     Review of Systems:   10 point review of systems was performed and was otherwise negative:  Constitutional: No fever Eyes: No visual disturbances ENT: No sore throat, ear pain Cardiac: No chest pain Respiratory: No shortness of breath, wheezing, or stridor Abdomen: He denies any right-sided abdominal pain. No persistent vomiting  Endocrine: No weight loss, No night sweats Extremities: No peripheral edema,  cyanosis Skin: No rashes, easy bruising Neurologic: No focal weakness, trouble with speech or swollowing Urologic: No dysuria, Hematuria, or urinary frequency   Physical Exam:  ED Triage Vitals [01/26/16 1636]  Enc Vitals Group     BP (!) 147/94     Pulse Rate 99     Resp 18     Temp 98.3 F (36.8 C)     Temp Source Oral     SpO2 100 %     Weight 210 lb (95.3 kg)     Height 5\' 9"  (1.753 m)     Head Circumference      Peak Flow      Pain Score 5     Pain Loc      Pain Edu?      Excl. in GC?     General: Awake , Alert , and Oriented times 3; GCS 15 Head: Normal cephalic , atraumatic Eyes: Pupils equal , round, reactive to light Nose/Throat: No nasal drainage, patent upper airway without erythema or exudate.  Neck: Supple, Full range of motion, No anterior adenopathy or palpable thyroid masses Lungs: Clear to ascultation without wheezes , rhonchi, or rales Heart: Regular rate, regular rhythm without murmurs , gallops , or rubs Abdomen: Patient has tenderness over the left lower quadrant with some mild guarding. No rebound or rigidity is noted. Bowel sounds are positive and somewhat hyperactive in all 4 quadrants. No reproducible right-sided upper or lower abdominal pain.    Extremities: 2 plus symmetric pulses. No edema, clubbing or cyanosis Neurologic:  normal ambulation, Motor symmetric without deficits, sensory intact Skin: warm, dry, no rashes   Labs:   All laboratory work was reviewed including any pertinent negatives or positives listed below:  Labs Reviewed  COMPREHENSIVE METABOLIC PANEL - Abnormal; Notable for the following:       Result Value   Glucose, Bld 107 (*)    All other components within normal limits  CBC - Abnormal; Notable for the following:    WBC 17.3 (*)    All other components within normal limits  URINALYSIS COMPLETEWITH MICROSCOPIC ([facility] ONLY) - Abnormal; Notable for the following:    Color, Urine YELLOW (*)    APPearance CLEAR (*)    All  other components within normal limits  LIPASE, BLOOD   Review laboratory work shows an elevated white blood cell count  Radiology:  "Ct Abdomen Pelvis W Contrast  Result Date: 01/26/2016 CLINICAL DATA:  Lower abdominal pain, loose stools, history of diverticulitis EXAM: CT ABDOMEN AND PELVIS WITH CONTRAST TECHNIQUE: Multidetector CT imaging of the abdomen and pelvis was performed using the standard protocol following bolus administration of intravenous contrast. CONTRAST:  100mL ISOVUE-300 IOPAMIDOL (ISOVUE-300) INJECTION 61% COMPARISON:  None. FINDINGS: Lower chest:  Mild lingular scarring/ atelectasis. Hepatobiliary: Liver is within normal limits. Gallbladder is unremarkable. No intrahepatic extrahepatic ductal dilatation. Pancreas: Within normal limits. Spleen: Within normal limits. Adrenals/Urinary Tract: Adrenal glands are within normal limits. Kidneys are within normal limits.  No hydronephrosis. Bladder is within normal limits. Stomach/Bowel: Stomach is within normal limits. No evidence of bowel obstruction. Normal appendix (series 2/ image 55). Scattered colonic diverticulosis. Associated focal wall thickening with pericolonic inflammatory changes involving the proximal sigmoid colon (series 2/image 72), compatible with sigmoid diverticulitis. No drainable fluid collection/abscess. No free air to suggest macroscopic perforation. Vascular/Lymphatic: No evidence of abdominal aortic aneurysm. No suspicious abdominopelvic lymphadenopathy. Reproductive: Prostate is unremarkable. Other: No abdominopelvic ascites. Tiny fat containing left inguinal hernia (series 2/ image 87). Musculoskeletal: Visualized osseous structures are within normal limits. IMPRESSION: Acute sigmoid diverticulitis. No drainable fluid collection/abscess. No free air to suggest macroscopic perforation. Electronically Signed   By: Charline BillsSriyesh  Krishnan M.D.   On: 01/26/2016 19:46  " * I personally reviewed the radiologic  studies    ED Course:  Given the patient's current clinical presentation and objective findings this differential is somewhat limited to gastroenteritis, urinary tract infection, etc. I felt given his clinical presentation and objective findings he had acute diverticulitis without complication such as perforation or abscess. The patient and I felt we could attempt outpatient treatment with Flagyl and Cipro. The patient was advised drink plenty of fluids and return here especially if he has increased pain, persistent vomiting, etc. I felt the Cipro was worth the risk especially for bowel coverage  Clinical Course     Assessment Acute diverticulitis   Final Clinical Impression:  Final diagnoses:  Diverticulitis of large intestine without perforation or abscess without bleeding     Plan: * Outpatient " Discharge Medication List as of 01/26/2016  8:06 PM    START taking these medications   Details  ciprofloxacin (CIPRO) 500 MG tablet Take 1 tablet (500 mg total) by mouth 2 (two) times daily., Starting Tue 01/26/2016, Until Tue 02/02/2016, Print    metroNIDAZOLE (FLAGYL) 500 MG tablet Take 1 tablet (500 mg total) by mouth 3 (three) times daily., Starting Tue 01/26/2016, Until Fri 02/05/2016, Print      " Patient was advised to return immediately if condition worsens. Patient was advised to follow  up with their primary care physician or other specialized physicians involved in their outpatient care. The patient and/or family member/power of attorney had laboratory results reviewed at the bedside. All questions and concerns were addressed and appropriate discharge instructions were distributed by the nursing staff.             Jennye Moccasin, MD 01/26/16 2230

## 2017-01-10 DIAGNOSIS — F17210 Nicotine dependence, cigarettes, uncomplicated: Secondary | ICD-10-CM | POA: Insufficient documentation

## 2017-01-10 DIAGNOSIS — I10 Essential (primary) hypertension: Secondary | ICD-10-CM | POA: Insufficient documentation

## 2017-01-10 DIAGNOSIS — Z79899 Other long term (current) drug therapy: Secondary | ICD-10-CM | POA: Insufficient documentation

## 2017-01-10 DIAGNOSIS — K5792 Diverticulitis of intestine, part unspecified, without perforation or abscess without bleeding: Secondary | ICD-10-CM | POA: Insufficient documentation

## 2017-01-10 LAB — COMPREHENSIVE METABOLIC PANEL
ALK PHOS: 62 U/L (ref 38–126)
ALT: 31 U/L (ref 17–63)
ANION GAP: 9 (ref 5–15)
AST: 21 U/L (ref 15–41)
Albumin: 4.1 g/dL (ref 3.5–5.0)
BILIRUBIN TOTAL: 0.8 mg/dL (ref 0.3–1.2)
BUN: 16 mg/dL (ref 6–20)
CALCIUM: 9 mg/dL (ref 8.9–10.3)
CO2: 25 mmol/L (ref 22–32)
CREATININE: 0.98 mg/dL (ref 0.61–1.24)
Chloride: 103 mmol/L (ref 101–111)
Glucose, Bld: 117 mg/dL — ABNORMAL HIGH (ref 65–99)
Potassium: 3.8 mmol/L (ref 3.5–5.1)
Sodium: 137 mmol/L (ref 135–145)
TOTAL PROTEIN: 7.4 g/dL (ref 6.5–8.1)

## 2017-01-10 LAB — URINALYSIS, COMPLETE (UACMP) WITH MICROSCOPIC
Bacteria, UA: NONE SEEN
Bilirubin Urine: NEGATIVE
GLUCOSE, UA: NEGATIVE mg/dL
Hgb urine dipstick: NEGATIVE
KETONES UR: NEGATIVE mg/dL
Leukocytes, UA: NEGATIVE
NITRITE: NEGATIVE
PROTEIN: NEGATIVE mg/dL
Specific Gravity, Urine: 1.021 (ref 1.005–1.030)
Squamous Epithelial / LPF: NONE SEEN
pH: 7 (ref 5.0–8.0)

## 2017-01-10 LAB — CBC
HCT: 43.8 % (ref 40.0–52.0)
HEMOGLOBIN: 15.1 g/dL (ref 13.0–18.0)
MCH: 30 pg (ref 26.0–34.0)
MCHC: 34.5 g/dL (ref 32.0–36.0)
MCV: 87.2 fL (ref 80.0–100.0)
Platelets: 233 10*3/uL (ref 150–440)
RBC: 5.02 MIL/uL (ref 4.40–5.90)
RDW: 14.2 % (ref 11.5–14.5)
WBC: 18.2 10*3/uL — ABNORMAL HIGH (ref 3.8–10.6)

## 2017-01-10 LAB — LIPASE, BLOOD: LIPASE: 19 U/L (ref 11–51)

## 2017-01-10 NOTE — ED Triage Notes (Addendum)
Pt in with co right sided abd pain x 3 days hx of diverticulitis states feels the same. Went to [facility] today and put on cipro and flagyl but did not have any testing done. Here for worsening pain. Pt states he also has pain at the end of urination.

## 2017-01-11 ENCOUNTER — Encounter: Payer: Self-pay | Admitting: Radiology

## 2017-01-11 ENCOUNTER — Emergency Department: Payer: Self-pay

## 2017-01-11 ENCOUNTER — Emergency Department
Admission: EM | Admit: 2017-01-11 | Discharge: 2017-01-11 | Disposition: A | Discharge status: Home / Self Care | Payer: Self-pay | Attending: Emergency Medicine | Admitting: Emergency Medicine

## 2017-01-11 DIAGNOSIS — K5792 Diverticulitis of intestine, part unspecified, without perforation or abscess without bleeding: Secondary | ICD-10-CM

## 2017-01-11 MED ORDER — MORPHINE SULFATE (PF) 4 MG/ML IV SOLN
4.0000 mg | Freq: Once | INTRAVENOUS | Status: AC
  Administered 2017-01-11: 4 mg via INTRAVENOUS

## 2017-01-11 MED ORDER — OXYCODONE-ACETAMINOPHEN 5-325 MG PO TABS: 1.0000 | ORAL_TABLET | ORAL | 0 refills | Status: DC | PRN

## 2017-01-11 MED ORDER — MORPHINE SULFATE (PF) 4 MG/ML IV SOLN
INTRAVENOUS | Status: AC
  Filled 2017-01-11: qty 1

## 2017-01-11 MED ORDER — MORPHINE SULFATE (PF) 4 MG/ML IV SOLN
INTRAVENOUS | Status: AC
  Administered 2017-01-11: 4 mg via INTRAVENOUS
  Filled 2017-01-11: qty 1

## 2017-01-11 MED ORDER — ONDANSETRON HCL 4 MG/2ML IJ SOLN
4.0000 mg | Freq: Once | INTRAMUSCULAR | Status: AC
Start: 2017-01-11 — End: 2017-01-11
  Administered 2017-01-11: 4 mg via INTRAVENOUS

## 2017-01-11 MED ORDER — ONDANSETRON HCL 4 MG/2ML IJ SOLN
INTRAMUSCULAR | Status: AC
  Filled 2017-01-11: qty 2

## 2017-01-11 MED ORDER — IOPAMIDOL (ISOVUE-300) INJECTION 61%
100.0000 mL | Freq: Once | INTRAVENOUS | Status: AC | PRN
  Administered 2017-01-11: 100 mL via INTRAVENOUS

## 2017-01-11 NOTE — ED Notes (Signed)
Patient transported to CT 

## 2017-01-11 NOTE — ED Notes (Signed)
Pt back from CT

## 2017-01-11 NOTE — ED Notes (Signed)
Patient called out stating his pain was coming back, seeking order for repeat pain medication administration.

## 2017-01-11 NOTE — ED Notes (Signed)
Pt up to toilet 

## 2017-01-11 NOTE — ED Provider Notes (Signed)
[facility] Emergency Department Provider Note   First MD Initiated Contact with Patient 01/11/17 [phone]     (approximate)  I have reviewed the triage vital signs and the nursing notes.   HISTORY  Chief Complaint Abdominal Pain    HPI Levin ErpJonathan Billing is a 31 y.o. male with history of diverticulitis presents to the emergency department with 2 day history of right lower quadrant abdominal pain. Patient states in the past his diverticulitis was located in the left lower quadrant. Patient states current pain score is 8 out of 10 described as sharp. Patient admits to nausea however no vomiting. Patient denies any urinary symptoms. Patient denied any diarrhea   Past Medical History:  Diagnosis Date  . Diverticulitis   . Hypertension     There are no active problems to display for this patient.   No past surgical history on file.  Prior to Admission medications   Medication Sig Start Date End Date Taking? Authorizing Provider  atomoxetine (STRATTERA) 40 MG capsule Take 1 capsule (40 mg total) by mouth daily. Take 1 daily for 2 weeks and then 2 daily 06/24/15   Gabriel CirriWicker, Cheryl, NP  gabapentin (NEURONTIN) 400 MG capsule Take 2 capsules (800 mg total) by mouth 3 (three) times daily. Take 2 capsules 3 times daily 07/17/15   Gabriel CirriWicker, Cheryl, NP  metoprolol succinate (TOPROL-XL) 25 MG 24 hr tablet Take 1 tablet (25 mg total) by mouth daily. 07/23/15   Gabriel CirriWicker, Cheryl, NP  oxyCODONE-acetaminophen (ROXICET) 5-325 MG tablet Take 1 tablet by mouth every 4 (four) hours as needed for severe pain. 01/11/17   Darci CurrentBrown, [place] N, MD    Allergies No known drug allergies  Family history Noncontributory  Social History Social History  Substance Use Topics  . Smoking status: Current Every Day Smoker    Types: Cigarettes  . Smokeless tobacco: Never Used  . Alcohol use No    Review of Systems Constitutional: No fever/chills Eyes: No visual changes. ENT: No sore  throat. Cardiovascular: Denies chest pain. Respiratory: Denies shortness of breath. Gastrointestinal: Positive for abdominal pain and nausea  Genitourinary: Negative for dysuria. Musculoskeletal: Negative for neck pain.  Negative for back pain. Integumentary: Negative for rash. Neurological: Negative for headaches, focal weakness or numbness.   ____________________________________________   PHYSICAL EXAM:  VITAL SIGNS: ED Triage Vitals  Enc Vitals Group     BP 01/10/17 2308 (!) 146/96     Pulse Rate 01/10/17 2308 (!) 118     Resp 01/10/17 2308 20     Temp 01/10/17 2308 99.7 F (37.6 C)     Temp Source 01/10/17 2308 Oral     SpO2 01/10/17 2308 97 %     Weight 01/10/17 2309 113.4 kg (250 lb)     Height 01/10/17 2309 1.753 m (5\' 9" )     Head Circumference --      Peak Flow --      Pain Score 01/10/17 2308 8     Pain Loc --      Pain Edu? --      Excl. in GC? --     Constitutional: Alert and oriented. Well appearing and in no acute distress. Eyes: Conjunctivae are normal. Head: Atraumatic. Mouth/Throat: Mucous membranes are moist. Oropharynx non-erythematous. Neck: No stridor.  Cardiovascular: Normal rate, regular rhythm. Good peripheral circulation. Grossly normal heart sounds. Respiratory: Normal respiratory effort.  No retractions. Lungs CTAB. Gastrointestinal: Right lower/Left lower quadrant tenderness to palpation.. No distention.  Musculoskeletal: No lower extremity  tenderness nor edema. No gross deformities of extremities. Neurologic:  Normal speech and language. No gross focal neurologic deficits are appreciated.  Skin:  Skin is warm, dry and intact. No rash noted. Psychiatric: Mood and affect are normal. Speech and behavior are normal.  ____________________________________________   LABS (all labs ordered are listed, but only abnormal results are displayed)  Labs Reviewed  CBC - Abnormal; Notable for the following:       Result Value   WBC 18.2 (*)    All  other components within normal limits  COMPREHENSIVE METABOLIC PANEL - Abnormal; Notable for the following:    Glucose, Bld 117 (*)    All other components within normal limits  URINALYSIS, COMPLETE (UACMP) WITH MICROSCOPIC - Abnormal; Notable for the following:    Color, Urine YELLOW (*)    APPearance CLEAR (*)    All other components within normal limits  LIPASE, BLOOD     RADIOLOGY I, [place] BROWN, personally viewed and evaluated these images (plain radiographs) as part of my medical decision making, as well as reviewing the written report by the radiologist.  Ct Abdomen Pelvis W Contrast  Result Date: 01/11/2017 CLINICAL DATA:  Acute onset of right-sided abdominal pain. Dysuria. Initial encounter. EXAM: CT ABDOMEN AND PELVIS WITH CONTRAST TECHNIQUE: Multidetector CT imaging of the abdomen and pelvis was performed using the standard protocol following bolus administration of intravenous contrast. CONTRAST:  ISOVUE-300 IOPAMIDOL (ISOVUE-300) INJECTION 61% COMPARISON:  CT of the abdomen and pelvis from 01/26/2016 FINDINGS: Lower chest: The visualized lung bases are grossly clear. The visualized portions of the mediastinum are unremarkable. Hepatobiliary: The liver is unremarkable in appearance. The gallbladder is unremarkable in appearance. The common bile duct remains normal in caliber. Pancreas: The pancreas is within normal limits. Spleen: The spleen is unremarkable in appearance. Adrenals/Urinary Tract: The adrenal glands are unremarkable in appearance. A small right renal cyst is noted. Nonspecific perinephric stranding is noted bilaterally. There is no evidence of hydronephrosis. No renal or ureteral stones are identified. Stomach/Bowel: The stomach is unremarkable in appearance. The small bowel is within normal limits. The appendix is normal in caliber, without evidence of appendicitis. The colon is unremarkable in appearance. Wall thickening is noted along the mid sigmoid colon,  with associated soft tissue inflammation, concerning for acute diverticulitis. A small amount of free fluid is noted within the upper pelvis. There is no definite evidence of perforation or abscess formation. Scattered diverticulosis is noted along the descending and sigmoid colon. Vascular/Lymphatic: The abdominal aorta is unremarkable in appearance. Mild calcification noted along the common iliac arteries bilaterally. The inferior vena cava is grossly unremarkable. No retroperitoneal lymphadenopathy is seen. No pelvic sidewall lymphadenopathy is identified. Reproductive: The bladder is mildly distended and grossly unremarkable in appearance. The prostate is normal in size. Other: No additional soft tissue abnormalities are seen. Musculoskeletal: No acute osseous abnormalities are identified. The visualized musculature is unremarkable in appearance. IMPRESSION: 1. Acute diverticulitis noted at the mid sigmoid colon, with wall thickening and soft tissue inflammation. Small amount of free fluid at the upper pelvis. No definite evidence of perforation or abscess formation. 2. Scattered diverticulosis along the descending and sigmoid colon. 3. Small right renal cyst noted. Electronically Signed   By: Roanna Raider M.D.   On: 01/11/2017 01:55      Procedures   ____________________________________________   INITIAL IMPRESSION / ASSESSMENT AND PLAN / ED COURSE  Pertinent labs & imaging results that were available during my care of the patient  were reviewed by me and considered in my medical decision making (see chart for details).  31 year old male presenting the emergency department with right lower quadrant abdominal pain. Clinical exam patient had bilateral lower quadrant abdominal pain however worse on the right. CT scan of the abdomen revealed diverticulitis. Patient received IV morphine in the emergency prompt resolution of pain. On repeat examination patient's pain much improved. Spoke with the  patient at length regarding warning signs for home including worsening pain fever vomiting and advised patient to return to the emergency Department if any of these were to happen.      ____________________________________________  FINAL CLINICAL IMPRESSION(S) / ED DIAGNOSES  Final diagnoses:  Acute diverticulitis     MEDICATIONS GIVEN DURING THIS VISIT:  Medications  morphine 4 MG/ML injection 4 mg (4 mg Intravenous Given 01/11/17 0121)  ondansetron (ZOFRAN) injection 4 mg (4 mg Intravenous Given 01/11/17 0121)  iopamidol (ISOVUE-300) 61 % injection 100 mL (100 mLs Intravenous Contrast Given 01/11/17 0136)  morphine 4 MG/ML injection 4 mg (4 mg Intravenous Given 01/11/17 0416)     NEW OUTPATIENT MEDICATIONS STARTED DURING THIS VISIT:  New Prescriptions   OXYCODONE-ACETAMINOPHEN (ROXICET) 5-325 MG TABLET    Take 1 tablet by mouth every 4 (four) hours as needed for severe pain.    Modified Medications   No medications on file    Discontinued Medications   No medications on file     Note:  This document was prepared using Dragon voice recognition software and may include unintentional dictation errors.    Darci Current, MD 01/11/17 [phone]

## 2017-01-11 NOTE — ED Notes (Signed)
Pt resting comfortably. Respirations even and unlabored.  

## 2017-01-11 NOTE — ED Notes (Signed)
Pt resting and talking on phone

## 2017-03-07 ENCOUNTER — Inpatient Hospital Stay
Admission: EM | Admit: 2017-03-07 | Discharge: 2017-03-10 | DRG: 392 | Disposition: A | Discharge status: Home / Self Care | Payer: Self-pay | Attending: Surgery | Admitting: Surgery

## 2017-03-07 ENCOUNTER — Encounter: Payer: Self-pay | Admitting: *Deleted

## 2017-03-07 ENCOUNTER — Emergency Department: Payer: Self-pay

## 2017-03-07 DIAGNOSIS — K5792 Diverticulitis of intestine, part unspecified, without perforation or abscess without bleeding: Secondary | ICD-10-CM | POA: Diagnosis present

## 2017-03-07 DIAGNOSIS — Z8371 Family history of colonic polyps: Secondary | ICD-10-CM

## 2017-03-07 DIAGNOSIS — Z23 Encounter for immunization: Secondary | ICD-10-CM

## 2017-03-07 DIAGNOSIS — Z800 Family history of malignant neoplasm of digestive organs: Secondary | ICD-10-CM

## 2017-03-07 DIAGNOSIS — I10 Essential (primary) hypertension: Secondary | ICD-10-CM | POA: Diagnosis present

## 2017-03-07 DIAGNOSIS — F17210 Nicotine dependence, cigarettes, uncomplicated: Secondary | ICD-10-CM | POA: Diagnosis present

## 2017-03-07 DIAGNOSIS — K5720 Diverticulitis of large intestine with perforation and abscess without bleeding: Principal | ICD-10-CM

## 2017-03-07 LAB — COMPREHENSIVE METABOLIC PANEL
ALBUMIN: 4 g/dL (ref 3.5–5.0)
ALK PHOS: 57 U/L (ref 38–126)
ALT: 17 U/L (ref 17–63)
ANION GAP: 9 (ref 5–15)
AST: 16 U/L (ref 15–41)
BUN: 13 mg/dL (ref 6–20)
CHLORIDE: 106 mmol/L (ref 101–111)
CO2: 25 mmol/L (ref 22–32)
Calcium: 9.5 mg/dL (ref 8.9–10.3)
Creatinine, Ser: 0.84 mg/dL (ref 0.61–1.24)
GFR calc non Af Amer: >60 mL/min (ref >60)
GLUCOSE: 104 mg/dL — AB (ref 65–99)
POTASSIUM: 4.4 mmol/L (ref 3.5–5.1)
SODIUM: 140 mmol/L (ref 135–145)
Total Bilirubin: 0.4 mg/dL (ref 0.3–1.2)
Total Protein: 7.3 g/dL (ref 6.5–8.1)

## 2017-03-07 LAB — CBC
HEMATOCRIT: 42.7 % (ref 40.0–52.0)
HEMOGLOBIN: 14.9 g/dL (ref 13.0–18.0)
MCH: 31 pg (ref 26.0–34.0)
MCHC: 35 g/dL (ref 32.0–36.0)
MCV: 88.7 fL (ref 80.0–100.0)
Platelets: 255 10*3/uL (ref 150–440)
RBC: 4.82 MIL/uL (ref 4.40–5.90)
RDW: 14.3 % (ref 11.5–14.5)
WBC: 10.7 10*3/uL — ABNORMAL HIGH (ref 3.8–10.6)

## 2017-03-07 LAB — URINALYSIS, COMPLETE (UACMP) WITH MICROSCOPIC
BACTERIA UA: NONE SEEN
Bilirubin Urine: NEGATIVE
Glucose, UA: NEGATIVE mg/dL
HGB URINE DIPSTICK: NEGATIVE
Ketones, ur: NEGATIVE mg/dL
Leukocytes, UA: NEGATIVE
Nitrite: NEGATIVE
PROTEIN: NEGATIVE mg/dL
pH: 5 (ref 5.0–8.0)

## 2017-03-07 LAB — LIPASE, BLOOD: LIPASE: 14 U/L (ref 11–51)

## 2017-03-07 MED ORDER — IOPAMIDOL (ISOVUE-300) INJECTION 61%
100.0000 mL | Freq: Once | INTRAVENOUS | Status: AC | PRN
  Administered 2017-03-07: 100 mL via INTRAVENOUS

## 2017-03-07 MED ORDER — HYDROMORPHONE HCL 1 MG/ML IJ SOLN
0.5000 mg | INTRAMUSCULAR | Status: DC | PRN
  Administered 2017-03-07 – 2017-03-10 (×12): 0.5 mg via INTRAVENOUS
  Filled 2017-03-07 (×8): qty 0.5
  Filled 2017-03-07: qty 1
  Filled 2017-03-07 (×3): qty 0.5

## 2017-03-07 MED ORDER — LACTATED RINGERS IV SOLN
INTRAVENOUS | Status: DC
  Administered 2017-03-07 – 2017-03-09 (×8): via INTRAVENOUS

## 2017-03-07 MED ORDER — OXYCODONE-ACETAMINOPHEN 5-325 MG PO TABS
1.0000 | ORAL_TABLET | ORAL | Status: DC | PRN
  Administered 2017-03-07 – 2017-03-08 (×3): 2 via ORAL
  Administered 2017-03-09: 1 via ORAL
  Administered 2017-03-09 (×2): 2 via ORAL
  Administered 2017-03-10: 1 via ORAL
  Administered 2017-03-10: 2 via ORAL
  Filled 2017-03-07 (×3): qty 2
  Filled 2017-03-07 (×3): qty 1
  Filled 2017-03-07 (×2): qty 2
  Filled 2017-03-07: qty 1

## 2017-03-07 MED ORDER — PIPERACILLIN-TAZOBACTAM 3.375 G IVPB 30 MIN
3.3750 g | Freq: Once | INTRAVENOUS | Status: AC
  Administered 2017-03-07: 3.375 g via INTRAVENOUS

## 2017-03-07 MED ORDER — KETOROLAC TROMETHAMINE 30 MG/ML IJ SOLN
30.0000 mg | Freq: Four times a day (QID) | INTRAMUSCULAR | Status: DC
  Administered 2017-03-08 – 2017-03-10 (×10): 30 mg via INTRAVENOUS
  Filled 2017-03-07 (×10): qty 1

## 2017-03-07 MED ORDER — PIPERACILLIN-TAZOBACTAM 3.375 G IVPB
3.3750 g | Freq: Three times a day (TID) | INTRAVENOUS | Status: DC
  Administered 2017-03-07 – 2017-03-10 (×9): 3.375 g via INTRAVENOUS
  Filled 2017-03-07 (×8): qty 50

## 2017-03-07 MED ORDER — INFLUENZA VAC SPLIT QUAD 0.5 ML IM SUSY
0.5000 mL | PREFILLED_SYRINGE | INTRAMUSCULAR | Status: AC
  Administered 2017-03-08: 0.5 mL via INTRAMUSCULAR
  Filled 2017-03-07: qty 0.5

## 2017-03-07 MED ORDER — MORPHINE SULFATE (PF) 4 MG/ML IV SOLN
4.0000 mg | Freq: Once | INTRAVENOUS | Status: AC
  Administered 2017-03-07: 4 mg via INTRAVENOUS
  Filled 2017-03-07: qty 1

## 2017-03-07 MED ORDER — PIPERACILLIN-TAZOBACTAM 3.375 G IVPB
3.3750 g | Freq: Three times a day (TID) | INTRAVENOUS | Status: DC
  Filled 2017-03-07: qty 50

## 2017-03-07 MED ORDER — ONDANSETRON HCL 4 MG PO TABS
4.0000 mg | ORAL_TABLET | Freq: Four times a day (QID) | ORAL | Status: DC | PRN
Start: 2017-03-07 — End: 2017-03-10

## 2017-03-07 MED ORDER — PIPERACILLIN-TAZOBACTAM 3.375 G IVPB 30 MIN
INTRAVENOUS | Status: AC
  Filled 2017-03-07: qty 50

## 2017-03-07 MED ORDER — HEPARIN SODIUM (PORCINE) 5000 UNIT/ML IJ SOLN
5000.0000 [IU] | Freq: Three times a day (TID) | INTRAMUSCULAR | Status: DC
  Administered 2017-03-07 – 2017-03-10 (×9): 5000 [IU] via SUBCUTANEOUS
  Filled 2017-03-07 (×10): qty 1

## 2017-03-07 MED ORDER — ONDANSETRON HCL 4 MG/2ML IJ SOLN
4.0000 mg | Freq: Once | INTRAMUSCULAR | Status: AC
  Administered 2017-03-07: 4 mg via INTRAVENOUS
  Filled 2017-03-07: qty 2

## 2017-03-07 MED ORDER — ONDANSETRON HCL 4 MG/2ML IJ SOLN
4.0000 mg | Freq: Four times a day (QID) | INTRAMUSCULAR | Status: DC | PRN
  Administered 2017-03-08 – 2017-03-09 (×3): 4 mg via INTRAVENOUS
  Filled 2017-03-07 (×3): qty 2

## 2017-03-07 NOTE — Progress Notes (Signed)
Pt noted that pain med 0.5 of Dilaudid is not helping his pain and requesting a higher dosage. MD notified and orders placed.Marland Kitchen

## 2017-03-07 NOTE — ED Notes (Signed)
Pt's mom reports that patient has a headache, pt denies any headache at this time.

## 2017-03-07 NOTE — ED Provider Notes (Signed)
[facility] Emergency Department Provider Note  ____________________________________________  Time seen: Approximately 1:20 PM  I have reviewed the triage vital signs and the nursing notes.   HISTORY  Chief Complaint Abdominal Pain   HPI Christopher Alvarez is a 31 y.o. male the history of diverticulitis and hypertension who presents for evaluation of abdominal pain. Patient reports that he feels similar to his prior episodes of diverticulitis.this episode started yesterday evening. He is complaining of 8 out of 10 pain located in the left lower quadrant, constant, nonradiating, sharp in quality. No nausea, no vomiting, no fever. He has had chills. No dysuria or hematuria. No prior abdominal surgeries.  Past Medical History:  Diagnosis Date  . Diverticulitis   . Hypertension     There are no active problems to display for this patient.   History reviewed. No pertinent surgical history.  Prior to Admission medications   Medication Sig Start Date End Date Taking? Authorizing Provider  atomoxetine (STRATTERA) 40 MG capsule Take 1 capsule (40 mg total) by mouth daily. Take 1 daily for 2 weeks and then 2 daily Patient not taking: Reported on 03/07/2017 06/24/15   Gabriel Cirri, NP  gabapentin (NEURONTIN) 400 MG capsule Take 2 capsules (800 mg total) by mouth 3 (three) times daily. Take 2 capsules 3 times daily Patient not taking: Reported on 03/07/2017 07/17/15   Gabriel Cirri, NP  metoprolol succinate (TOPROL-XL) 25 MG 24 hr tablet Take 1 tablet (25 mg total) by mouth daily. Patient not taking: Reported on 03/07/2017 07/23/15   Gabriel Cirri, NP  oxyCODONE-acetaminophen (ROXICET) 5-325 MG tablet Take 1 tablet by mouth every 4 (four) hours as needed for severe pain. Patient not taking: Reported on 03/07/2017 01/11/17   Darci Current, MD    Allergies Patient has no known allergies.  No family history on file.  Social History Social History  Substance Use  Topics  . Smoking status: Current Every Day Smoker    Packs/day: 1.00    Types: Cigarettes  . Smokeless tobacco: Never Used  . Alcohol use No    Review of Systems  Constitutional: Negative for fever. + chills Eyes: Negative for visual changes. ENT: Negative for sore throat. Neck: No neck pain  Cardiovascular: Negative for chest pain. Respiratory: Negative for shortness of breath. Gastrointestinal: + LLQ abdominal pain. No vomiting or diarrhea. Genitourinary: Negative for dysuria. Musculoskeletal: Negative for back pain. Skin: Negative for rash. Neurological: Negative for headaches, weakness or numbness. Psych: No SI or HI  ____________________________________________   PHYSICAL EXAM:  VITAL SIGNS: ED Triage Vitals  Enc Vitals Group     BP 03/07/17 1149 (!) 146/91     Pulse Rate 03/07/17 1149 76     Resp 03/07/17 1149 20     Temp 03/07/17 1149 98.4 F (36.9 C)     Temp Source 03/07/17 1149 Oral     SpO2 03/07/17 1149 98 %     Weight 03/07/17 1149 230 lb (104.3 kg)     Height 03/07/17 1149  (1.753 m)     Head Circumference --      Peak Flow --      Pain Score 03/07/17 1148 7     Pain Loc --      Pain Edu? --      Excl. in GC? --     Constitutional: Alert and oriented. Well appearing and in no apparent distress. HEENT:      Head: Normocephalic and atraumatic.  Eyes: Conjunctivae are normal. Sclera is non-icteric.       Mouth/Throat: Mucous membranes are moist.       Neck: Supple with no signs of meningismus. Cardiovascular: Regular rate and rhythm. No murmurs, gallops, or rubs. 2+ symmetrical distal pulses are present in all extremities. No JVD. Respiratory: Normal respiratory effort. Lungs are clear to auscultation bilaterally. No wheezes, crackles, or rhonchi.  Gastrointestinal: Soft, ttp over the LLQ with localized guarding, and non distended with positive bowel sounds. No rebound or guarding. GU: Bilateral testicles are descended with no  tenderness to palpation, bilateral positive cremasteric reflexes are present, no swelling or erythema of the scrotum. No evidence of inguinal hernia. Musculoskeletal: Nontender with normal range of motion in all extremities. No edema, cyanosis, or erythema of extremities. Neurologic: Normal speech and language. Face is symmetric. Moving all extremities. No gross focal neurologic deficits are appreciated. Skin: Skin is warm, dry and intact. No rash noted. Psychiatric: Mood and affect are normal. Speech and behavior are normal.  ____________________________________________   LABS (all labs ordered are listed, but only abnormal results are displayed)  Labs Reviewed  COMPREHENSIVE METABOLIC PANEL - Abnormal; Notable for the following:       Result Value   Glucose, Bld 104 (*)    All other components within normal limits  CBC - Abnormal; Notable for the following:    WBC 10.7 (*)    All other components within normal limits  LIPASE, BLOOD  URINALYSIS, COMPLETE (UACMP) WITH MICROSCOPIC   ____________________________________________  EKG  none  ____________________________________________  RADIOLOGY  CT a/p: Changes consistent with sigmoid diverticulitis with increase in the degree of extraluminal fluid and a new area of air when compared with the recent exam consistent with developing abscess.  The remainder of the study is stable from the prior exam. ____________________________________________   PROCEDURES  Procedure(s) performed: None Procedures Critical Care performed:  Yes  CRITICAL CARE Performed by: Nita Sickle  ?  Total critical care time: 35 min  Critical care time was exclusive of separately billable procedures and treating other patients.  Critical care was necessary to treat or prevent imminent or life-threatening deterioration.  Critical care was time spent personally by me on the following activities: development of treatment plan with patient  and/or surrogate as well as nursing, discussions with consultants, evaluation of patient's response to treatment, examination of patient, obtaining history from patient or surrogate, ordering and performing treatments and interventions, ordering and review of laboratory studies, ordering and review of radiographic studies, pulse oximetry and re-evaluation of patient's condition.  ____________________________________________   INITIAL IMPRESSION / ASSESSMENT AND PLAN / ED COURSE   31 y.o. male the history of diverticulitis and hypertension who presents for evaluation of abdominal pain. Patient is well appearing, NAD, normal vitals, his abdomen is soft with tenderness to palpation on the left lower quadrantwith localized guarding. Since patient has localized guarding also sent him for repeat CT to rule out any possible complications of diverticulitis including perforation or abscess. We'll treat his pain with morphine and Zofran. labs showing leukocytosis with white count of 10.7.    _________________________ 2:25 PM on 03/07/2017 ----------------------------------------- CT concerned for diverticulitis with intra-abdominal abscess. Discussed with Dr. Excell Seltzer, surgeon on call who will recommended IV antibiotics and admission. Will give IV Zosyn and admit patient to his service.   Pertinent labs & imaging results that were available during my care of the patient were reviewed by me and considered in my medical decision making (see chart for  details).    ____________________________________________   FINAL CLINICAL IMPRESSION(S) / ED DIAGNOSES  Final diagnoses:  Diverticulitis of large intestine with abscess without bleeding      NEW MEDICATIONS STARTED DURING THIS VISIT:  New Prescriptions   No medications on file     Note:  This document was prepared using Dragon voice recognition software and may include unintentional dictation errors.    Don Perking, [place], MD 03/07/17  [phone]

## 2017-03-07 NOTE — Progress Notes (Signed)
Pharmacist - Prescriber Communication  Zosyn 3.375 gm IV Q8H over 30 minutes has been modified to Zosyn 3.375 gm IV Q8H EI over 240 minutes per P&T protocol.   Christopher Alvarez A. [place], [place].D., BCPS Clinical Pharmacist 03/07/2017 15:15

## 2017-03-07 NOTE — H&P (Signed)
Christopher Alvarez is an 31 y.o. male.    Chief Complaint: Abdominal pain  HPI: This a patient with abdominal pain he's pointing to the suprapubic and left lower quadrant. He's had this happen 3-4 times in the past all of which have required trips to the emergency room with a diagnosis of diverticulitis his most recent trip was in August and he was treated with oral antibiotics but did not follow up. This event started on Saturday 4 days ago and has been gradually worsening ease had no fevers or chills no nausea or vomiting and points to the suprapubic area he states he was constipated took some milk of magnesia and that was relieved he's had no melena or hematochezia.  He works as a Web designer smokes tobacco does not drink alcohol but at one time was a very heavy drinker.  He has significant family history where his mother had precancerous polyps in the colon and his uncle had colon cancer.  Past Medical History:  Diagnosis Date  . Diverticulitis   . Hypertension     History reviewed. No pertinent surgical history.  No family history on file. Social History:  reports that he has been smoking Cigarettes.  He has been smoking about 1.00 pack per day. He has never used smokeless tobacco. He reports that he does not drink alcohol or use drugs.  Allergies: No Known Allergies   (Not in a hospital admission)   Review of Systems  Constitutional: Negative.   HENT: Negative.   Eyes: Negative.   Respiratory: Negative.   Cardiovascular: Negative.   Gastrointestinal: Positive for abdominal pain and constipation. Negative for blood in stool, diarrhea, heartburn, nausea and vomiting.  Genitourinary: Negative.   Musculoskeletal: Negative.   Skin: Negative.   Neurological: Negative.   Endo/Heme/Allergies: Negative.   Psychiatric/Behavioral: Negative.      Physical Exam:  BP 129/88 (BP Location: Left Arm)   Pulse 76   Temp 98.4 F (36.9 C) (Oral)   Resp 19   Ht 5' 9"  (1.753 m)   Wt 230  lb (104.3 kg)   SpO2 99%   BMI 33.97 kg/m   Physical Exam  Constitutional: He is oriented to person, place, and time and well-developed, well-nourished, and in no distress. No distress.  HENT:  Head: Normocephalic and atraumatic.  Eyes: Pupils are equal, round, and reactive to light. Right eye exhibits no discharge. Left eye exhibits no discharge. No scleral icterus.  Neck: Normal range of motion.  Cardiovascular: Normal rate and normal heart sounds.   Pulmonary/Chest: Effort normal and breath sounds normal.  Abdominal: Soft. He exhibits no distension and no mass. There is tenderness. There is guarding. There is no rebound.  Tenderness in the suprapubic and left lower quadrant area with some mild guarding no percussion tenderness no rebound  Genitourinary: Penis normal. No discharge found.  Musculoskeletal: He exhibits no edema or tenderness.  Lymphadenopathy:    He has no cervical adenopathy.  Neurological: He is alert and oriented to person, place, and time.  Skin: Skin is warm and dry. No rash noted. He is not diaphoretic. No erythema.  Multiple piercings  Psychiatric: Mood and affect normal.  Vitals reviewed.       Results for orders placed or performed during the hospital encounter of 03/07/17 (from the past 48 hour(s))  Lipase, blood     Status: None   Collection Time: 03/07/17 11:53 AM  Result Value Ref Range   Lipase 14 11 - 51 U/L  Comprehensive metabolic panel  Status: Abnormal   Collection Time: 03/07/17 11:53 AM  Result Value Ref Range   Sodium 140 135 - 145 mmol/L   Potassium 4.4 3.5 - 5.1 mmol/L   Chloride 106 101 - 111 mmol/L   CO2 25 22 - 32 mmol/L   Glucose, Bld 104 (H) 65 - 99 mg/dL   BUN 13 6 - 20 mg/dL   Creatinine, Ser 0.84 0.61 - 1.24 mg/dL   Calcium 9.5 8.9 - 10.3 mg/dL   Total Protein 7.3 6.5 - 8.1 g/dL   Albumin 4.0 3.5 - 5.0 g/dL   AST 16 15 - 41 U/L   ALT 17 17 - 63 U/L   Alkaline Phosphatase 57 38 - 126 U/L   Total Bilirubin 0.4 0.3 -  1.2 mg/dL   GFR calc non Af Amer >60 >60 mL/min   GFR calc Af Amer >60 >60 mL/min    Comment: (NOTE) The eGFR has been calculated using the CKD EPI equation. This calculation has not been validated in all clinical situations. eGFR's persistently <60 mL/min signify possible Chronic Kidney Disease.    Anion gap 9 5 - 15  CBC     Status: Abnormal   Collection Time: 03/07/17 11:53 AM  Result Value Ref Range   WBC 10.7 (H) 3.8 - 10.6 K/uL   RBC 4.82 4.40 - 5.90 MIL/uL   Hemoglobin 14.9 13.0 - 18.0 g/dL   HCT 42.7 40.0 - 52.0 %   MCV 88.7 80.0 - 100.0 fL   MCH 31.0 26.0 - 34.0 pg   MCHC 35.0 32.0 - 36.0 g/dL   RDW 14.3 11.5 - 14.5 %   Platelets 255 150 - 440 K/uL   Ct Abdomen Pelvis W Contrast  Result Date: 03/07/2017 CLINICAL DATA:  Abdominal pain and history of diverticulitis EXAM: CT ABDOMEN AND PELVIS WITH CONTRAST TECHNIQUE: Multidetector CT imaging of the abdomen and pelvis was performed using the standard protocol following bolus administration of intravenous contrast. CONTRAST:  157m ISOVUE-300 IOPAMIDOL (ISOVUE-300) INJECTION 61% COMPARISON:  01/11/2017 FINDINGS: Lower chest: No acute abnormality. Hepatobiliary: No focal liver abnormality is seen. No gallstones, gallbladder wall thickening, or biliary dilatation. Pancreas: Unremarkable. No pancreatic ductal dilatation or surrounding inflammatory changes. Spleen: Normal in size without focal abnormality. Adrenals/Urinary Tract: Adrenal glands are within normal limits. No renal calculi or obstructive changes are noted. A small cyst is noted within the right kidney stable from the prior exam. The bladder is within normal limits. Stomach/Bowel: The appendix is unremarkable. Diverticular changes noted within the colon with evidence of diverticulitis in the sigmoid colon slightly progressed when compared with the prior exam. There is been slight increase in the fluid collection extrinsic to the colon now measuring approximately 3.9 by 1.8 cm.  A small amount of air is noted within consistent with developing abscess. Vascular/Lymphatic: Aortic atherosclerosis. No enlarged abdominal or pelvic lymph nodes. Reproductive: Prostate is unremarkable. Other: No abdominal wall hernia or abnormality. No abdominopelvic ascites. Musculoskeletal: No acute or significant osseous findings. IMPRESSION: Changes consistent with sigmoid diverticulitis with increase in the degree of extraluminal fluid and a new area of air when compared with the recent exam consistent with developing abscess. The remainder of the study is stable from the prior exam. Electronically Signed   By: MInez CatalinaM.D.   On: 03/07/2017 14:08     Assessment/Plan  This a patient with acute diverticulitis. He's had multiple episodes. He has been successfully treated in the past with oral antibiotics but this time he has a  microperforation with small abscess formation. His CT scan is been personally reviewed. I recommended admission to the hospital with IV antibiotics and should he get through this without a colostomy then the elective procedure should be considered because of his multiple recurrences. His mother understood and agreed with this plan  Florene Glen, MD, FACS

## 2017-03-07 NOTE — ED Triage Notes (Signed)
Pt with a hx of diverticulitis x 1 year. Flair up started last night with cramping and general weakness

## 2017-03-07 NOTE — ED Notes (Signed)
Pt was seen by admitting MD. NAD noted, resting in bed with mother at bedside. Abx administered per MD order. Will continue to monitor for further patient needs.

## 2017-03-08 DIAGNOSIS — K5720 Diverticulitis of large intestine with perforation and abscess without bleeding: Secondary | ICD-10-CM

## 2017-03-08 LAB — BASIC METABOLIC PANEL
ANION GAP: 5 (ref 5–15)
BUN: 10 mg/dL (ref 6–20)
CALCIUM: 8.8 mg/dL — AB (ref 8.9–10.3)
CO2: 28 mmol/L (ref 22–32)
Chloride: 105 mmol/L (ref 101–111)
Creatinine, Ser: 0.8 mg/dL (ref 0.61–1.24)
GFR calc Af Amer: >60 mL/min (ref >60)
GFR calc non Af Amer: >60 mL/min (ref >60)
GLUCOSE: 118 mg/dL — AB (ref 65–99)
POTASSIUM: 3.8 mmol/L (ref 3.5–5.1)
Sodium: 138 mmol/L (ref 135–145)

## 2017-03-08 LAB — CBC WITH DIFFERENTIAL/PLATELET
BASOS ABS: 0 10*3/uL (ref 0–0.1)
Basophils Relative: 0 %
EOS PCT: 1 %
Eosinophils Absolute: 0.1 10*3/uL (ref 0–0.7)
HEMATOCRIT: 40.2 % (ref 40.0–52.0)
Hemoglobin: 13.9 g/dL (ref 13.0–18.0)
LYMPHS ABS: 2.4 10*3/uL (ref 1.0–3.6)
LYMPHS PCT: 26 %
MCH: 31.1 pg (ref 26.0–34.0)
MCHC: 34.6 g/dL (ref 32.0–36.0)
MCV: 89.7 fL (ref 80.0–100.0)
MONO ABS: 0.8 10*3/uL (ref 0.2–1.0)
Monocytes Relative: 9 %
NEUTROS ABS: 6 10*3/uL (ref 1.4–6.5)
Neutrophils Relative %: 64 %
Platelets: 224 10*3/uL (ref 150–440)
RBC: 4.49 MIL/uL (ref 4.40–5.90)
RDW: 14.2 % (ref 11.5–14.5)
WBC: 9.3 10*3/uL (ref 3.8–10.6)

## 2017-03-08 LAB — HIV ANTIBODY (ROUTINE TESTING W REFLEX): HIV Screen 4th Generation wRfx: NONREACTIVE

## 2017-03-08 NOTE — Progress Notes (Signed)
CC: Acute diverticulitis Subjective: This patient with acute diverticulitis. He states his pain is better but is still requiring pain medications. He has no nausea vomiting no fevers or chills points to the left lower quadrant for the area of his pain. No other complaints. He is tolerating clear liquid diet.  Objective: Vital signs in last 24 hours: Temp:  [97.5 F (36.4 C)-98.4 F (36.9 C)] 97.6 F (36.4 C) (10/03 0407) Pulse Rate:  [56-76] 56 (10/03 0407) Resp:  [18-20] 18 (10/03 0407) BP: (116-146)/(76-93) 131/82 (10/03 0407) SpO2:  [95 %-99 %] 98 % (10/03 0407) Weight:  [230 lb (104.3 kg)] 230 lb (104.3 kg) (10/02 1149) Last BM Date: 03/07/17  Intake/Output from previous day: 10/02 0701 - 10/03 0700 In: 2703 [P.O.:660; I.V.:1993; IV Piggyback:50] Out: 550 [Urine:550] Intake/Output this shift: No intake/output data recorded.  Physical exam:  Vital signs are stable he is afebrile abdomen is soft and minimally distended minimally tympanitic and tender in the left lower quadrant with some guarding but no rebound or percussion tenderness. Calves are nontender. No icterus no jaundice  Lab Results: CBC   Recent Labs  03/07/17 1153 03/08/17 0345  WBC 10.7* 9.3  HGB 14.9 13.9  HCT 42.7 40.2  PLT 255 224   BMET  Recent Labs  03/07/17 1153 03/08/17 0345  NA 140 138  K 4.4 3.8  CL 106 105  CO2 25 28  GLUCOSE 104* 118*  BUN 13 10  CREATININE 0.84 0.80  CALCIUM 9.5 8.8*   PT/INR No results for input(s): LABPROT, INR in the last 72 hours. ABG No results for input(s): PHART, HCO3 in the last 72 hours.  Invalid input(s): PCO2, PO2  Studies/Results: Ct Abdomen Pelvis W Contrast  Result Date: 03/07/2017 CLINICAL DATA:  Abdominal pain and history of diverticulitis EXAM: CT ABDOMEN AND PELVIS WITH CONTRAST TECHNIQUE: Multidetector CT imaging of the abdomen and pelvis was performed using the standard protocol following bolus administration of intravenous contrast.  CONTRAST:  ISOVUE-300 IOPAMIDOL (ISOVUE-300) INJECTION 61% COMPARISON:  01/11/2017 FINDINGS: Lower chest: No acute abnormality. Hepatobiliary: No focal liver abnormality is seen. No gallstones, gallbladder wall thickening, or biliary dilatation. Pancreas: Unremarkable. No pancreatic ductal dilatation or surrounding inflammatory changes. Spleen: Normal in size without focal abnormality. Adrenals/Urinary Tract: Adrenal glands are within normal limits. No renal calculi or obstructive changes are noted. A small cyst is noted within the right kidney stable from the prior exam. The bladder is within normal limits. Stomach/Bowel: The appendix is unremarkable. Diverticular changes noted within the colon with evidence of diverticulitis in the sigmoid colon slightly progressed when compared with the prior exam. There is been slight increase in the fluid collection extrinsic to the colon now measuring approximately 3.9 by 1.8 cm. A small amount of air is noted within consistent with developing abscess. Vascular/Lymphatic: Aortic atherosclerosis. No enlarged abdominal or pelvic lymph nodes. Reproductive: Prostate is unremarkable. Other: No abdominal wall hernia or abnormality. No abdominopelvic ascites. Musculoskeletal: No acute or significant osseous findings. IMPRESSION: Changes consistent with sigmoid diverticulitis with increase in the degree of extraluminal fluid and a new area of air when compared with the recent exam consistent with developing abscess. The remainder of the study is stable from the prior exam. Electronically Signed   By: Alcide Clever M.D.   On: 03/07/2017 14:08    Anti-infectives: Anti-infectives    Start     Dose/Rate Route Frequency Ordered Stop   03/07/17 2100  piperacillin-tazobactam (ZOSYN) IVPB 3.375 g     3.375  g 12.5 mL/hr over 240 Minutes Intravenous Every 8 hours 03/07/17 1639     03/07/17 1515  piperacillin-tazobactam (ZOSYN) IVPB 3.375 g  Status:  Discontinued     3.375 g 12.5  mL/hr over 240 Minutes Intravenous Every 8 hours 03/07/17 1511 03/07/17 1639   03/07/17 1430  piperacillin-tazobactam (ZOSYN) IVPB 3.375 g     3.375 g 100 mL/hr over 30 Minutes Intravenous  Once 03/07/17 1423 03/07/17 1524      Assessment/Plan:  This patient with acute diverticulitis with microperforation and small abscess. White blood cell count is improved today. Recommend continued on clear liquids with IV antibiotics. Potentially repeat scan in a few days. Continue to observe at this point.  Lattie Haw, MD, FACS  03/08/2017

## 2017-03-09 NOTE — Progress Notes (Signed)
CC: Perforated diverticulitis Subjective: Patient is starting to feel better now he feels better and he did yesterday. He has no nausea or vomiting and no fevers or chills. He would like to advance his diet.  Objective: Vital signs in last 24 hours: Temp:  [97.6 F (36.4 C)-97.9 F (36.6 C)] 97.6 F (36.4 C) (10/04 0412) Pulse Rate:  [56-75] 56 (10/04 0412) Resp:  [17-18] 17 (10/04 0412) BP: (102-135)/(61-82) 102/61 (10/04 0412) SpO2:  [95 %-97 %] 95 % (10/04 0412) Last BM Date: 03/07/17  Intake/Output from previous day: 10/03 0701 - 10/04 0700 In: 4390 [P.O.:1200; I.V.:3040; IV Piggyback:150] Out: 1400 [Urine:1400] Intake/Output this shift: No intake/output data recorded.  Physical exam:  Abdomen is soft less distended and less tender with no peritoneal signs Awake alert and oriented  Vital signs are stable and afebrile  Calves are nontender no icterus no jaundice  Lab Results: CBC   Recent Labs  03/07/17 1153 03/08/17 0345  WBC 10.7* 9.3  HGB 14.9 13.9  HCT 42.7 40.2  PLT 255 224   BMET  Recent Labs  03/07/17 1153 03/08/17 0345  NA 140 138  K 4.4 3.8  CL 106 105  CO2 25 28  GLUCOSE 104* 118*  BUN 13 10  CREATININE 0.84 0.80  CALCIUM 9.5 8.8*   PT/INR No results for input(s): LABPROT, INR in the last 72 hours. ABG No results for input(s): PHART, HCO3 in the last 72 hours.  Invalid input(s): PCO2, PO2  Studies/Results: Ct Abdomen Pelvis W Contrast  Result Date: 03/07/2017 CLINICAL DATA:  Abdominal pain and history of diverticulitis EXAM: CT ABDOMEN AND PELVIS WITH CONTRAST TECHNIQUE: Multidetector CT imaging of the abdomen and pelvis was performed using the standard protocol following bolus administration of intravenous contrast. CONTRAST:  ISOVUE-300 IOPAMIDOL (ISOVUE-300) INJECTION 61% COMPARISON:  01/11/2017 FINDINGS: Lower chest: No acute abnormality. Hepatobiliary: No focal liver abnormality is seen. No gallstones, gallbladder wall  thickening, or biliary dilatation. Pancreas: Unremarkable. No pancreatic ductal dilatation or surrounding inflammatory changes. Spleen: Normal in size without focal abnormality. Adrenals/Urinary Tract: Adrenal glands are within normal limits. No renal calculi or obstructive changes are noted. A small cyst is noted within the right kidney stable from the prior exam. The bladder is within normal limits. Stomach/Bowel: The appendix is unremarkable. Diverticular changes noted within the colon with evidence of diverticulitis in the sigmoid colon slightly progressed when compared with the prior exam. There is been slight increase in the fluid collection extrinsic to the colon now measuring approximately 3.9 by 1.8 cm. A small amount of air is noted within consistent with developing abscess. Vascular/Lymphatic: Aortic atherosclerosis. No enlarged abdominal or pelvic lymph nodes. Reproductive: Prostate is unremarkable. Other: No abdominal wall hernia or abnormality. No abdominopelvic ascites. Musculoskeletal: No acute or significant osseous findings. IMPRESSION: Changes consistent with sigmoid diverticulitis with increase in the degree of extraluminal fluid and a new area of air when compared with the recent exam consistent with developing abscess. The remainder of the study is stable from the prior exam. Electronically Signed   By: Alcide Clever M.D.   On: 03/07/2017 14:08    Anti-infectives: Anti-infectives    Start     Dose/Rate Route Frequency Ordered Stop   03/07/17 2100  piperacillin-tazobactam (ZOSYN) IVPB 3.375 g     3.375 g 12.5 mL/hr over 240 Minutes Intravenous Every 8 hours 03/07/17 1639     03/07/17 1515  piperacillin-tazobactam (ZOSYN) IVPB 3.375 g  Status:  Discontinued     3.375 g  12.5 mL/hr over 240 Minutes Intravenous Every 8 hours 03/07/17 1511 03/07/17 1639   03/07/17 1430  piperacillin-tazobactam (ZOSYN) IVPB 3.375 g     3.375 g 100 mL/hr over 30 Minutes Intravenous  Once 03/07/17 1423  03/07/17 1524      Assessment/Plan:  Patient showing slow improvement. We will repeat white blood cell count tomorrow and if it is improving and he continues to improve subjectively and objectively and we can discharge on oral antibiotics. If not then repeating a CAT scan may be indicated and possible surgery should he not completely improved prior to discharge.  Lattie Haw, MD, FACS  03/09/2017

## 2017-03-10 LAB — CBC WITH DIFFERENTIAL/PLATELET
BASOS ABS: 0 10*3/uL (ref 0–0.1)
BASOS PCT: 1 %
EOS ABS: 0.1 10*3/uL (ref 0–0.7)
EOS PCT: 2 %
HEMATOCRIT: 40.7 % (ref 40.0–52.0)
Hemoglobin: 14.2 g/dL (ref 13.0–18.0)
LYMPHS PCT: 37 %
Lymphs Abs: 2.6 10*3/uL (ref 1.0–3.6)
MCH: 31.1 pg (ref 26.0–34.0)
MCHC: 34.9 g/dL (ref 32.0–36.0)
MCV: 89.3 fL (ref 80.0–100.0)
MONO ABS: 0.7 10*3/uL (ref 0.2–1.0)
MONOS PCT: 9 %
NEUTROS ABS: 3.6 10*3/uL (ref 1.4–6.5)
Neutrophils Relative %: 51 %
PLATELETS: 265 10*3/uL (ref 150–440)
RBC: 4.56 MIL/uL (ref 4.40–5.90)
RDW: 14.2 % (ref 11.5–14.5)
WBC: 7.1 10*3/uL (ref 3.8–10.6)

## 2017-03-10 MED ORDER — CIPROFLOXACIN HCL 500 MG PO TABS: 500.0000 mg | ORAL_TABLET | Freq: Two times a day (BID) | ORAL | 1 refills | Status: DC

## 2017-03-10 MED ORDER — OXYCODONE-ACETAMINOPHEN 5-325 MG PO TABS: 1.0000 | ORAL_TABLET | ORAL | 0 refills | Status: DC | PRN

## 2017-03-10 MED ORDER — METRONIDAZOLE 500 MG PO TABS: 500.0000 mg | ORAL_TABLET | Freq: Three times a day (TID) | ORAL | 1 refills | Status: DC

## 2017-03-10 NOTE — Discharge Planning (Signed)
Patient tolerating diet with little discomfort. No N/V noted at this time.

## 2017-03-10 NOTE — Discharge Planning (Signed)
Patient IV removed.  RN assessment and VS revealed stability for DC to home.  Discharge papers given, explained and educated.  Informed of suggested FU appt and appts made.  Pain script signed and printed. Once ready, will be walked to front and family transporting via car.

## 2017-03-10 NOTE — Progress Notes (Signed)
CC: Acute diverticulitis Subjective: This patient with acute diverticulitis with perforation. He is been on IV antibiotics for several days. He feels much better now and has much less pain. No nausea vomiting no fevers or chills. He is tolerating a clear liquid diet.  Objective: Vital signs in last 24 hours: Temp:  [97.9 F (36.6 C)-98.2 F (36.8 C)] 97.9 F (36.6 C) (10/05 0521) Pulse Rate:  [57-65] 65 (10/05 0521) Resp:  [16] 16 (10/05 0521) BP: (109-140)/(79-80) 109/80 (10/05 0521) SpO2:  [97 %-99 %] 97 % (10/05 0521) Last BM Date: 03/09/17  Intake/Output from previous day: 10/04 0701 - 10/05 0700 In: 1020 [P.O.:1020] Out: 1000 [Urine:1000] Intake/Output this shift: No intake/output data recorded.  Physical exam:  Vital signs stable afebrile. Awake alert and oriented. No icterus no jaundice. Abdomen is soft and much less tender no peritoneal signs less distended. Nontender calves  Lab Results: CBC   Recent Labs  03/08/17 0345 03/10/17 0415  WBC 9.3 7.1  HGB 13.9 14.2  HCT 40.2 40.7  PLT 224 265   BMET  Recent Labs  03/07/17 1153 03/08/17 0345  NA 140 138  K 4.4 3.8  CL 106 105  CO2 25 28  GLUCOSE 104* 118*  BUN 13 10  CREATININE 0.84 0.80  CALCIUM 9.5 8.8*   PT/INR No results for input(s): LABPROT, INR in the last 72 hours. ABG No results for input(s): PHART, HCO3 in the last 72 hours.  Invalid input(s): PCO2, PO2  Studies/Results: No results found.  Anti-infectives: Anti-infectives    Start     Dose/Rate Route Frequency Ordered Stop   03/10/17 0000  ciprofloxacin (CIPRO) 500 MG tablet     500 mg Oral 2 times daily 03/10/17 0717     03/10/17 0000  metroNIDAZOLE (FLAGYL) 500 MG tablet     500 mg Oral 3 times daily 03/10/17 0717     03/07/17 2100  piperacillin-tazobactam (ZOSYN) IVPB 3.375 g     3.375 g 12.5 mL/hr over 240 Minutes Intravenous Every 8 hours 03/07/17 1639     03/07/17 1515  piperacillin-tazobactam (ZOSYN) IVPB 3.375 g  Status:   Discontinued     3.375 g 12.5 mL/hr over 240 Minutes Intravenous Every 8 hours 03/07/17 1511 03/07/17 1639   03/07/17 1430  piperacillin-tazobactam (ZOSYN) IVPB 3.375 g     3.375 g 100 mL/hr over 30 Minutes Intravenous  Once 03/07/17 1423 03/07/17 1524      Assessment/Plan:  Normalized white blood cell count. Patient doing very well will continue IV antibiotics this morning and reexamine after advancing diet. He may be able to go home later today on oral antibiotics.  Lattie Haw, MD, FACS  03/10/2017

## 2017-03-10 NOTE — Discharge Summary (Signed)
Physician Discharge Summary  Patient ID: Christopher Alvarez MRN: [id number] DOB/AGE: 01/07/1986 31 y.o.  Admit date: 03/07/2017 Discharge date: 03/10/2017   Discharge Diagnoses:  Active Problems:   Acute diverticulitis   Diverticulitis of large intestine with abscess without bleeding   Procedures:None  Hospital Course: This patient admitted the hospital with signs of perforated diverticulitis with small abscess. In the hospital on IV antibiotics he has improved considerably. His pain is much improved and he has no nausea or vomiting he is tolerating a regular diet. He will be discharged today on Cipro Flagyl as well as oral analgesics as needed. He is instructed to follow-up in the emergency room should he have any worsening of pain or fevers. He will follow-up in our office in 10 days for reevaluation.  Consults: None  Disposition: 01-Home or Self Care   Allergies as of 03/10/2017   No Known Allergies     Medication List    TAKE these medications   atomoxetine 40 MG capsule Commonly known as:  STRATTERA Take 1 capsule (40 mg total) by mouth daily. Take 1 daily for 2 weeks and then 2 daily   ciprofloxacin 500 MG tablet Commonly known as:  CIPRO Take 1 tablet (500 mg total) by mouth 2 (two) times daily.   gabapentin 400 MG capsule Commonly known as:  NEURONTIN Take 2 capsules (800 mg total) by mouth 3 (three) times daily. Take 2 capsules 3 times daily   metoprolol succinate 25 MG 24 hr tablet Commonly known as:  TOPROL-XL Take 1 tablet (25 mg total) by mouth daily.   metroNIDAZOLE 500 MG tablet Commonly known as:  FLAGYL Take 1 tablet (500 mg total) by mouth 3 (three) times daily.   oxyCODONE-acetaminophen 5-325 MG tablet Commonly known as:  ROXICET Take 1-2 tablets by mouth every 4 (four) hours as needed for severe pain. What changed:  how much to take      Follow-up Information    Ricarda Frame, MD. Go on 03/20/2017.   Specialty:  General Surgery Why:  Monday  at 2:00pm for hospital follow-up Contact information: [address] [place] [address] [phone]           Lattie Haw, MD, FACS

## 2017-03-10 NOTE — Discharge Planning (Signed)
Patient has just had BM.

## 2017-03-10 NOTE — Discharge Instructions (Signed)
Resume home medications Soft diet Utilize oral analgesics as necessary Take oral antibiotics Follow up with [facility] in 10 days Return to the emergency room for worsening pain or fevers

## 2017-03-10 NOTE — Progress Notes (Signed)
Patient feels well this afternoon he is tolerating a diet no fevers or chills and pain is much improved.  Abdomen is soft and minimally tender without peritoneal signs much improved over prior exams.  Operative the patient discharged today versus staying 1 more night but I think he wants to go home today as he voiced. I will send him home on Cipro and Flagyl with oral analgesics and he will follow-up in my office in 10 days.

## 2017-03-13 ENCOUNTER — Telehealth: Payer: Self-pay

## 2017-03-13 NOTE — Telephone Encounter (Signed)
Post-op call made to patient at this time. Spoke with patient  . Post-op interview questions below.  1. How are you feeling? Better   2. Is your pain controlled? yes  3. What are you doing for the pain? Oxycodone  4. Are you having any Nausea or Vomiting? no  5. Are you having any Fever or Chills? no  6. Are you having any Constipation or Diarrhea? no  7. Is there any Swelling or Bruising you are concerned about? no  8. Do you have any questions or concerns at this time? No    Discussion: Instructed to finish course of antibiotics. He is feeling better.  Follow up appointment discussed and he verbalized understanding.

## 2017-03-14 ENCOUNTER — Telehealth: Payer: Self-pay

## 2017-03-14 NOTE — Telephone Encounter (Signed)
Patient called at this time and asked if there was an over the counter medication he could take since he is now out of pain medication. I advised patient that he can take some tylenol for his pain. Patient verbalized understanding at this time.

## 2017-03-16 ENCOUNTER — Inpatient Hospital Stay
Admission: EM | Admit: 2017-03-16 | Discharge: 2017-03-24 | DRG: 392 | Disposition: A | Discharge status: Home / Self Care | Payer: Self-pay | Attending: General Surgery | Admitting: General Surgery

## 2017-03-16 ENCOUNTER — Encounter: Payer: Self-pay | Admitting: *Deleted

## 2017-03-16 ENCOUNTER — Inpatient Hospital Stay: Payer: Self-pay

## 2017-03-16 DIAGNOSIS — R630 Anorexia: Secondary | ICD-10-CM

## 2017-03-16 DIAGNOSIS — Z79899 Other long term (current) drug therapy: Secondary | ICD-10-CM

## 2017-03-16 DIAGNOSIS — F17210 Nicotine dependence, cigarettes, uncomplicated: Secondary | ICD-10-CM | POA: Diagnosis present

## 2017-03-16 DIAGNOSIS — I10 Essential (primary) hypertension: Secondary | ICD-10-CM | POA: Diagnosis present

## 2017-03-16 DIAGNOSIS — D72829 Elevated white blood cell count, unspecified: Secondary | ICD-10-CM

## 2017-03-16 DIAGNOSIS — R109 Unspecified abdominal pain: Secondary | ICD-10-CM | POA: Diagnosis present

## 2017-03-16 DIAGNOSIS — K5720 Diverticulitis of large intestine with perforation and abscess without bleeding: Principal | ICD-10-CM

## 2017-03-16 DIAGNOSIS — R1030 Lower abdominal pain, unspecified: Secondary | ICD-10-CM

## 2017-03-16 LAB — LIPASE, BLOOD: Lipase: 16 U/L (ref 11–51)

## 2017-03-16 LAB — COMPREHENSIVE METABOLIC PANEL
ALBUMIN: 4.5 g/dL (ref 3.5–5.0)
ALK PHOS: 54 U/L (ref 38–126)
ALT: 51 U/L (ref 17–63)
AST: 38 U/L (ref 15–41)
Anion gap: 12 (ref 5–15)
BILIRUBIN TOTAL: 0.6 mg/dL (ref 0.3–1.2)
BUN: 16 mg/dL (ref 6–20)
CALCIUM: 9.8 mg/dL (ref 8.9–10.3)
CO2: 23 mmol/L (ref 22–32)
Chloride: 101 mmol/L (ref 101–111)
Creatinine, Ser: 0.86 mg/dL (ref 0.61–1.24)
GFR calc Af Amer: >60 mL/min (ref >60)
GFR calc non Af Amer: >60 mL/min (ref >60)
GLUCOSE: 105 mg/dL — AB (ref 65–99)
Potassium: 4 mmol/L (ref 3.5–5.1)
Sodium: 136 mmol/L (ref 135–145)
TOTAL PROTEIN: 8.1 g/dL (ref 6.5–8.1)

## 2017-03-16 LAB — CBC
HCT: 49.3 % (ref 40.0–52.0)
Hemoglobin: 17 g/dL (ref 13.0–18.0)
MCH: 30.4 pg (ref 26.0–34.0)
MCHC: 34.5 g/dL (ref 32.0–36.0)
MCV: 88.1 fL (ref 80.0–100.0)
PLATELETS: 284 10*3/uL (ref 150–440)
RBC: 5.6 MIL/uL (ref 4.40–5.90)
RDW: 14.5 % (ref 11.5–14.5)
WBC: 12.6 10*3/uL — ABNORMAL HIGH (ref 3.8–10.6)

## 2017-03-16 MED ORDER — ONDANSETRON HCL 4 MG/2ML IJ SOLN
4.0000 mg | Freq: Once | INTRAMUSCULAR | Status: AC
  Administered 2017-03-16: 4 mg via INTRAVENOUS
  Filled 2017-03-16: qty 2

## 2017-03-16 MED ORDER — IOPAMIDOL (ISOVUE-300) INJECTION 61%
30.0000 mL | Freq: Once | INTRAVENOUS | Status: AC | PRN
  Administered 2017-03-16: 30 mL via ORAL

## 2017-03-16 MED ORDER — IOPAMIDOL (ISOVUE-300) INJECTION 61%
100.0000 mL | Freq: Once | INTRAVENOUS | Status: AC | PRN
  Administered 2017-03-16: 100 mL via INTRAVENOUS

## 2017-03-16 MED ORDER — ENOXAPARIN SODIUM 40 MG/0.4ML ~~LOC~~ SOLN
40.0000 mg | SUBCUTANEOUS | Status: DC
  Administered 2017-03-16 – 2017-03-20 (×5): 40 mg via SUBCUTANEOUS
  Filled 2017-03-16 (×5): qty 0.4

## 2017-03-16 MED ORDER — DEXTROSE IN LACTATED RINGERS 5 % IV SOLN
INTRAVENOUS | Status: DC
  Administered 2017-03-16 – 2017-03-18 (×4): via INTRAVENOUS

## 2017-03-16 MED ORDER — HYDROMORPHONE HCL 1 MG/ML IJ SOLN
1.0000 mg | INTRAMUSCULAR | Status: DC | PRN
  Administered 2017-03-16 – 2017-03-18 (×9): 1 mg via INTRAVENOUS
  Filled 2017-03-16 (×9): qty 1

## 2017-03-16 MED ORDER — PIPERACILLIN-TAZOBACTAM 3.375 G IVPB 30 MIN
3.3750 g | Freq: Once | INTRAVENOUS | Status: AC
  Administered 2017-03-16: 3.375 g via INTRAVENOUS

## 2017-03-16 MED ORDER — DIPHENHYDRAMINE HCL 25 MG PO CAPS: 25.0000 mg | ORAL_CAPSULE | Freq: Four times a day (QID) | ORAL | Status: DC | PRN

## 2017-03-16 MED ORDER — HYDROMORPHONE HCL 1 MG/ML IJ SOLN
1.0000 mg | Freq: Once | INTRAMUSCULAR | Status: AC
  Administered 2017-03-16: 1 mg via INTRAVENOUS
  Filled 2017-03-16: qty 1

## 2017-03-16 MED ORDER — HYDRALAZINE HCL 20 MG/ML IJ SOLN
10.0000 mg | INTRAMUSCULAR | Status: DC | PRN
  Filled 2017-03-16: qty 1

## 2017-03-16 MED ORDER — SODIUM CHLORIDE 0.9 % IV BOLUS (SEPSIS)
1000.0000 mL | Freq: Once | INTRAVENOUS | Status: AC
  Administered 2017-03-16: 1000 mL via INTRAVENOUS

## 2017-03-16 MED ORDER — DIPHENHYDRAMINE HCL 50 MG/ML IJ SOLN: 25.0000 mg | Freq: Four times a day (QID) | INTRAMUSCULAR | Status: DC | PRN

## 2017-03-16 MED ORDER — PIPERACILLIN-TAZOBACTAM 3.375 G IVPB
3.3750 g | Freq: Three times a day (TID) | INTRAVENOUS | Status: DC
  Administered 2017-03-17 – 2017-03-24 (×22): 3.375 g via INTRAVENOUS
  Filled 2017-03-16 (×22): qty 50

## 2017-03-16 MED ORDER — PIPERACILLIN-TAZOBACTAM 3.375 G IVPB 30 MIN
INTRAVENOUS | Status: AC
  Administered 2017-03-16: 3.375 g via INTRAVENOUS
  Filled 2017-03-16: qty 50

## 2017-03-16 MED ORDER — PANTOPRAZOLE SODIUM 40 MG IV SOLR
40.0000 mg | Freq: Every day | INTRAVENOUS | Status: DC
  Administered 2017-03-16 – 2017-03-23 (×8): 40 mg via INTRAVENOUS
  Filled 2017-03-16 (×8): qty 40

## 2017-03-16 MED ORDER — ONDANSETRON HCL 4 MG/2ML IJ SOLN: 4.0000 mg | Freq: Four times a day (QID) | INTRAMUSCULAR | Status: DC | PRN

## 2017-03-16 MED ORDER — ONDANSETRON 4 MG PO TBDP: 4.0000 mg | ORAL_TABLET | Freq: Four times a day (QID) | ORAL | Status: DC | PRN

## 2017-03-16 NOTE — ED Triage Notes (Signed)
PT to ED reporting lower abd pain for over a week. PT reports he was hospitalized last week for diverticulitis with a "micro bowel perforation" PT reports they treated him with antibiotics but did not address the micro perferation and that they were considering surgery if the symptoms did not improve with antibiotic therapy. Pt denies vomiting and report shaving BM this morning that was not diarrhea. Pt reports "my stool is really thin though, like the size of my pinky finger"  No fevers reported at home. No changes in urine.
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
## 2017-03-17 ENCOUNTER — Inpatient Hospital Stay: Payer: Self-pay | Admitting: Surgery

## 2017-03-17 LAB — BASIC METABOLIC PANEL
Anion gap: 5 (ref 5–15)
BUN: 12 mg/dL (ref 6–20)
CALCIUM: 8.7 mg/dL — AB (ref 8.9–10.3)
CO2: 27 mmol/L (ref 22–32)
CREATININE: 0.75 mg/dL (ref 0.61–1.24)
Chloride: 106 mmol/L (ref 101–111)
Glucose, Bld: 100 mg/dL — ABNORMAL HIGH (ref 65–99)
Potassium: 3.7 mmol/L (ref 3.5–5.1)
SODIUM: 138 mmol/L (ref 135–145)

## 2017-03-17 LAB — CBC
HCT: 43.2 % (ref 40.0–52.0)
Hemoglobin: 14.9 g/dL (ref 13.0–18.0)
MCH: 30.1 pg (ref 26.0–34.0)
MCHC: 34.6 g/dL (ref 32.0–36.0)
MCV: 87 fL (ref 80.0–100.0)
Platelets: 234 10*3/uL (ref 150–440)
RBC: 4.97 MIL/uL (ref 4.40–5.90)
RDW: 14.5 % (ref 11.5–14.5)
WBC: 8.6 10*3/uL (ref 3.8–10.6)

## 2017-03-17 LAB — PHOSPHORUS: PHOSPHORUS: 3.4 mg/dL (ref 2.5–4.6)

## 2017-03-17 LAB — URINALYSIS, COMPLETE (UACMP) WITH MICROSCOPIC
BILIRUBIN URINE: NEGATIVE
Bacteria, UA: NONE SEEN
GLUCOSE, UA: NEGATIVE mg/dL
HGB URINE DIPSTICK: NEGATIVE
Ketones, ur: NEGATIVE mg/dL
Leukocytes, UA: NEGATIVE
NITRITE: NEGATIVE
PH: 6 (ref 5.0–8.0)
Protein, ur: NEGATIVE mg/dL
RBC / HPF: NONE SEEN RBC/hpf (ref 0–5)
SPECIFIC GRAVITY, URINE: 1.016 (ref 1.005–1.030)
Squamous Epithelial / LPF: NONE SEEN

## 2017-03-17 LAB — MAGNESIUM: MAGNESIUM: 1.8 mg/dL (ref 1.7–2.4)

## 2017-03-17 MED ORDER — ALPRAZOLAM 0.25 MG PO TABS
0.2500 mg | ORAL_TABLET | Freq: Three times a day (TID) | ORAL | Status: DC | PRN
  Administered 2017-03-17 – 2017-03-19 (×4): 0.25 mg via ORAL
  Filled 2017-03-17 (×4): qty 1

## 2017-03-17 MED ORDER — OXYCODONE-ACETAMINOPHEN 5-325 MG PO TABS
1.0000 | ORAL_TABLET | ORAL | Status: DC | PRN
  Administered 2017-03-17: 2 via ORAL
  Administered 2017-03-17: 1 via ORAL
  Administered 2017-03-18: 2 via ORAL
  Filled 2017-03-17 (×2): qty 2
  Filled 2017-03-17: qty 1

## 2017-03-17 NOTE — Progress Notes (Signed)
Per Dr. Excell Seltzer okay to place order for percocet 5/325 1-2 tablets q 4hours PRN for pain.

## 2017-03-18 MED ORDER — OXYCODONE-ACETAMINOPHEN 5-325 MG PO TABS
1.0000 | ORAL_TABLET | ORAL | Status: DC | PRN
  Administered 2017-03-18 (×3): 1 via ORAL
  Filled 2017-03-18 (×3): qty 1

## 2017-03-18 MED ORDER — KETOROLAC TROMETHAMINE 30 MG/ML IJ SOLN
30.0000 mg | Freq: Four times a day (QID) | INTRAMUSCULAR | Status: AC
  Administered 2017-03-18 – 2017-03-23 (×18): 30 mg via INTRAVENOUS
  Filled 2017-03-18 (×21): qty 1

## 2017-03-18 MED ORDER — ACETAMINOPHEN 325 MG PO TABS
650.0000 mg | ORAL_TABLET | Freq: Four times a day (QID) | ORAL | Status: DC | PRN
  Administered 2017-03-23: 650 mg via ORAL
  Filled 2017-03-18: qty 2

## 2017-03-18 MED ORDER — KCL IN DEXTROSE-NACL 20-5-0.45 MEQ/L-%-% IV SOLN
INTRAVENOUS | Status: DC
  Administered 2017-03-18 – 2017-03-20 (×5): via INTRAVENOUS
  Filled 2017-03-18 (×6): qty 1000

## 2017-03-18 NOTE — Progress Notes (Signed)
SURGICAL PROGRESS NOTE (cpt [phone])  Hospital Day(s): 2.   Post op day(s):  Marland Kitchen   Interval History: Patient seen and examined, no acute events or new complaints overnight. Patient reports his pain has improved since admission and being restarted on IV antibiotics (Zosyn this admission) with +flatus, no BM, denies N/V, fever/chills, CP, or SOB.  Review of Systems:  Constitutional: denies fever, chills  HEENT: denies cough or congestion  Respiratory: denies any shortness of breath  Cardiovascular: denies chest pain or palpitations  Gastrointestinal: abdominal pain, N/V, and bowel function as per interval history Genitourinary: denies burning with urination or urinary frequency Musculoskeletal: denies pain, decreased motor or sensation Integumentary: denies any other rashes or skin discolorations Neurological: denies HA or vision/hearing changes   Vital signs in last 24 hours: [min-max] current  Temp:  [97.5 F (36.4 C)-98.1 F (36.7 C)] 97.9 F (36.6 C) (10/13 0450) Pulse Rate:  [50-76] 50 (10/13 0450) Resp:  [18] 18 (10/13 0450) BP: (114-131)/(71-111) 114/71 (10/13 0450) SpO2:  [96 %-99 %] 98 % (10/13 0450)     Height:  (175.3 cm) Weight: 228 lb 6.4 oz (103.6 kg) BMI (Calculated): 33.71   Intake/Output this shift:  Total I/O In: 813.4 [P.O.:240; I.V.:573.4] Out: 1000 [Urine:1000]   Intake/Output last 2 shifts:  @   Physical Exam:  Constitutional: alert, cooperative and no distress  HENT: normocephalic without obvious abnormality  Eyes: PERRL, EOM's grossly intact and symmetric  Neuro: CN II - XII grossly intact and symmetric without deficit  Respiratory: breathing non-labored at rest  Cardiovascular: regular rate and sinus rhythm  Gastrointestinal: soft and non-distended with mild LLQ abdominal tenderness to palpation Musculoskeletal: UE and LE FROM, no edema or wounds, motor and sensation grossly intact, NT   Labs:  CBC Latest Ref Rng & Units  03/17/2017 03/16/2017 03/10/2017  WBC 3.8 - 10.6 K/uL 8.6 12.6(H) 7.1  Hemoglobin 13.0 - 18.0 g/dL 01.0 27.2 53.6  Hematocrit 40.0 - 52.0 % 43.2 49.3 40.7  Platelets 150 - 440 K/uL 234 284 265   CMP Latest Ref Rng & Units 03/17/2017 03/16/2017 03/08/2017  Glucose 65 - 99 mg/dL 644(I) 347(Q) 259(D)  BUN 6 - 20 mg/dL Creatinine 0.61 - 1.24 mg/dL 6.38 7.56 4.33  Sodium 135 - 145 mmol/L 138 136 138  Potassium 3.5 - 5.1 mmol/L 3.7 4.0 3.8  Chloride 101 - 111 mmol/L 106 101 105  CO2 22 - 32 mmol/L Calcium 8.9 - 10.3 mg/dL 2.9(J) 9.8 1.8(A)  Total Protein 6.5 - 8.1 g/dL - 8.1 -  Total Bilirubin 0.3 - 1.2 mg/dL - 0.6 -  Alkaline Phos 38 - 126 U/L - 54 -  AST 15 - 41 U/L - 38 -  ALT 17 - 63 U/L - 51 -   Imaging studies: No new pertinent imaging studies   Assessment/Plan: (ICD-10's: K54.20) 31 y.o. male with recurrent / persistent acute sigmoid colonic diverticulitis with small peri-colonic abscess, complicated by pertinent comorbidities including only HTN.   - IV antibiotics (Zosyn)  - pain control prn, minimize narcotics   - advance to full liquids diet, decrease IVF maintenance rate  - continue to monitor abdominal exam and bowel function  - when ready for discharge, likely Augmentin x 2 weeks  - plan for outpatient elective sigmoid colectomy  - DVT prophylaxis, ambulation encouraged  All of the above findings and recommendations were discussed with the patient, and all of patient's questions were answered to his expressed  satisfaction.  -- Scherrie Gerlach Earlene Plater, MD, RPVI [place]: [facility] General Surgery - Partnering for exceptional care. Office: [phone]

## 2017-03-19 MED ORDER — DOCUSATE SODIUM 100 MG PO CAPS
100.0000 mg | ORAL_CAPSULE | Freq: Every day | ORAL | Status: AC
  Administered 2017-03-20 – 2017-03-23 (×4): 100 mg via ORAL
  Filled 2017-03-19 (×5): qty 1

## 2017-03-19 MED ORDER — DOCUSATE SODIUM 100 MG PO CAPS: 100.0000 mg | ORAL_CAPSULE | Freq: Every day | ORAL | Status: DC | PRN

## 2017-03-19 MED ORDER — LORAZEPAM 0.5 MG PO TABS
0.5000 mg | ORAL_TABLET | Freq: Four times a day (QID) | ORAL | Status: DC | PRN
  Administered 2017-03-19 – 2017-03-23 (×9): 0.5 mg via ORAL
  Filled 2017-03-19 (×9): qty 1

## 2017-03-19 MED ORDER — OXYCODONE-ACETAMINOPHEN 5-325 MG PO TABS
2.0000 | ORAL_TABLET | Freq: Four times a day (QID) | ORAL | Status: DC | PRN
  Administered 2017-03-19: 2 via ORAL
  Filled 2017-03-19: qty 2

## 2017-03-19 MED ORDER — HYDROMORPHONE HCL 1 MG/ML IJ SOLN
0.5000 mg | INTRAMUSCULAR | Status: DC | PRN
Start: 2017-03-19 — End: 2017-03-20
  Administered 2017-03-20 (×3): 0.5 mg via INTRAVENOUS
  Filled 2017-03-19 (×3): qty 0.5

## 2017-03-19 MED ORDER — OXYCODONE-ACETAMINOPHEN 5-325 MG PO TABS
1.0000 | ORAL_TABLET | Freq: Four times a day (QID) | ORAL | Status: DC | PRN
  Administered 2017-03-19 – 2017-03-20 (×5): 2 via ORAL
  Filled 2017-03-19 (×5): qty 2

## 2017-03-19 MED ORDER — HYDROMORPHONE HCL 1 MG/ML IJ SOLN
1.0000 mg | Freq: Once | INTRAMUSCULAR | Status: AC
  Administered 2017-03-19: 1 mg via INTRAVENOUS
  Filled 2017-03-19: qty 1

## 2017-03-19 NOTE — Progress Notes (Signed)
SURGICAL PROGRESS NOTE (cpt [phone])  Hospital Day(s): 3.   Post op day(s):  Marland Kitchen   Interval History: Patient seen and examined, no acute events or new complaints overnight. Patient reports LLQ abdominal pain about the same as yesterday (better yesterday afternoon and again this morning, though an episode of pain overnight) and tolerating diet and ambulation with +flatus, denies N/V, fever/chills, CP, or SOB.  Review of Systems:  Constitutional: denies fever, chills  HEENT: denies cough or congestion  Respiratory: denies any shortness of breath  Cardiovascular: denies chest pain or palpitations  Gastrointestinal: abdominal pain, N/V, and bowel function as per interval history Genitourinary: denies burning with urination or urinary frequency Musculoskeletal: denies pain, decreased motor or sensation Integumentary: denies any other rashes or skin discolorations Neurological: denies HA or vision/hearing changes   Vital signs in last 24 hours: [min-max] current  Temp:  [97.5 F (36.4 C)-97.7 F (36.5 C)] 97.5 F (36.4 C) (10/14 0424) Pulse Rate:  [60-75] 60 (10/14 0424) Resp:  [16] 16 (10/14 0424) BP: (130-138)/(65-81) 131/65 (10/14 0424) SpO2:  [97 %-99 %] 97 % (10/14 0424)     Height:  (175.3 cm) Weight: 228 lb 6.4 oz (103.6 kg) BMI (Calculated): 33.71   Intake/Output this shift:  No intake/output data recorded.   Intake/Output last 2 shifts:  @   Physical Exam:  Constitutional: alert, cooperative and no distress  HENT: normocephalic without obvious abnormality  Eyes: PERRL, EOM's grossly intact and symmetric  Neuro: CN II - XII grossly intact and symmetric without deficit  Respiratory: breathing non-labored at rest  Cardiovascular: regular rate and sinus rhythm  Gastrointestinal: soft and non-distended with mild LLQ tenderness to palpation Musculoskeletal: UE and LE FROM, no edema or wounds, motor and sensation grossly intact, NT   Labs:  CBC Latest Ref  Rng & Units 03/17/2017 03/16/2017 03/10/2017  WBC 3.8 - 10.6 K/uL 8.6 12.6(H) 7.1  Hemoglobin 13.0 - 18.0 g/dL 19.1 47.8 29.5  Hematocrit 40.0 - 52.0 % 43.2 49.3 40.7  Platelets 150 - 440 K/uL 234 284 265   CMP Latest Ref Rng & Units 03/17/2017 03/16/2017 03/08/2017  Glucose 65 - 99 mg/dL 621(H) 086(V) 784(O)  BUN 6 - 20 mg/dL Creatinine 0.61 - 1.24 mg/dL 9.62 9.52 8.41  Sodium 135 - 145 mmol/L 138 136 138  Potassium 3.5 - 5.1 mmol/L 3.7 4.0 3.8  Chloride 101 - 111 mmol/L 106 101 105  CO2 22 - 32 mmol/L Calcium 8.9 - 10.3 mg/dL 3.2(G) 9.8 4.0(N)  Total Protein 6.5 - 8.1 g/dL - 8.1 -  Total Bilirubin 0.3 - 1.2 mg/dL - 0.6 -  Alkaline Phos 38 - 126 U/L - 54 -  AST 15 - 41 U/L - 38 -  ALT 17 - 63 U/L - 51 -   Imaging studies: No new pertinent imaging studies   Assessment/Plan: (ICD-10's: K27.20) 31 y.o. male with recurrent / persistent acute sigmoid colonic diverticulitis with small peri-colonic abscess, complicated by pertinent comorbidities including only HTN.              - IV antibiotics (Zosyn)             - pain control prn, minimize narcotics              - continue to monitor abdominal exam and bowel function             - will continue full liquids diet for now, anticipate soft diet  tomorrow             - when ready for discharge, likely Augmentin x 2 weeks             - plan for outpatient elective sigmoid colectomy             - DVT prophylaxis, ambulation encouraged  All of the above findings and recommendations were discussed with the patient, and all of patient's questions were answered to his expressed satisfaction.  -- Scherrie Gerlach Earlene Plater, MD, RPVI [place]: [facility] General Surgery - Partnering for exceptional care. Office: [phone]

## 2017-03-19 NOTE — Progress Notes (Signed)
Patient ID: Christopher Alvarez, male   DOB: 17-Feb-1986, 31 y.o.   MRN: [id number]   I was contacted by nurse referring that the patient was complaining of abdominal pain. Patient had taken Percocet 2 tabs 3 hours ago and has not feel any relieved. Also the nurse was referring that some family members have some questions about patient condition.   I went to evaluated the patient who was resting at bed complaining of persistent pain on the left lower quadrant. The pains has not get worse since this morning but has not improved neither. Patient refers that due to the pain, has not feel any appetite and has not eaten anything today. Patient refers that Hydromorphone yesterday relieved the pain. I oriented the patient that I will re order one dose of Hydromorphone and re assess pain. On physical exam there is mild-mod tenderness on the left lower quadrant without guarding and no rebound tenderness.   No family members were present during my evaluation to be able to answer their concerns. Patient did not have any other concerned other that wanting the pain to improve. All questions were answered to the patient and he refers understood the current plan.

## 2017-03-19 NOTE — Progress Notes (Signed)
Spoke to Dr. Maia PlanTrisha Mangle about patient and family concern about pain control and labwork; Md order spoke to patient and order additional pain medication; Educated patient and family on pain control. Will continue to monitor

## 2017-03-20 ENCOUNTER — Inpatient Hospital Stay: Payer: Self-pay | Admitting: General Surgery

## 2017-03-20 DIAGNOSIS — K5720 Diverticulitis of large intestine with perforation and abscess without bleeding: Principal | ICD-10-CM

## 2017-03-20 LAB — CBC
HCT: 45.1 % (ref 40.0–52.0)
HEMOGLOBIN: 15.2 g/dL (ref 13.0–18.0)
MCH: 29.8 pg (ref 26.0–34.0)
MCHC: 33.8 g/dL (ref 32.0–36.0)
MCV: 88.1 fL (ref 80.0–100.0)
PLATELETS: 250 10*3/uL (ref 150–440)
RBC: 5.12 MIL/uL (ref 4.40–5.90)
RDW: 14.4 % (ref 11.5–14.5)
WBC: 11 10*3/uL — ABNORMAL HIGH (ref 3.8–10.6)

## 2017-03-20 LAB — COMPREHENSIVE METABOLIC PANEL
ALBUMIN: 3.7 g/dL (ref 3.5–5.0)
ALT: 24 U/L (ref 17–63)
ANION GAP: 8 (ref 5–15)
AST: 20 U/L (ref 15–41)
Alkaline Phosphatase: 48 U/L (ref 38–126)
BUN: 7 mg/dL (ref 6–20)
CHLORIDE: 102 mmol/L (ref 101–111)
CO2: 27 mmol/L (ref 22–32)
CREATININE: 0.88 mg/dL (ref 0.61–1.24)
Calcium: 9 mg/dL (ref 8.9–10.3)
GFR calc non Af Amer: >60 mL/min (ref >60)
GLUCOSE: 99 mg/dL (ref 65–99)
Potassium: 4 mmol/L (ref 3.5–5.1)
SODIUM: 137 mmol/L (ref 135–145)
Total Bilirubin: 0.8 mg/dL (ref 0.3–1.2)
Total Protein: 6.9 g/dL (ref 6.5–8.1)

## 2017-03-20 MED ORDER — OXYCODONE-ACETAMINOPHEN 5-325 MG PO TABS
1.0000 | ORAL_TABLET | ORAL | Status: DC | PRN
  Administered 2017-03-20 – 2017-03-23 (×8): 2 via ORAL
  Administered 2017-03-23 – 2017-03-24 (×2): 1 via ORAL
  Administered 2017-03-24 (×2): 2 via ORAL
  Filled 2017-03-20: qty 1
  Filled 2017-03-20 (×11): qty 2

## 2017-03-20 MED ORDER — SODIUM CHLORIDE 0.9 % IV BOLUS (SEPSIS)
1000.0000 mL | Freq: Once | INTRAVENOUS | Status: AC
  Administered 2017-03-20: 1000 mL via INTRAVENOUS

## 2017-03-20 MED ORDER — LACTATED RINGERS IV SOLN
INTRAVENOUS | Status: DC
  Administered 2017-03-20 – 2017-03-23 (×10): via INTRAVENOUS

## 2017-03-20 MED ORDER — HYDROMORPHONE HCL 1 MG/ML IJ SOLN
1.0000 mg | INTRAMUSCULAR | Status: DC | PRN
  Administered 2017-03-20 – 2017-03-23 (×15): 1 mg via INTRAVENOUS
  Filled 2017-03-20 (×16): qty 1

## 2017-03-20 NOTE — Progress Notes (Signed)
With patient and patient's request. He was uncomfortable with thephysicians explanations over the weekend but understands his conditionis serious. We reviewed the findings of his abscess that is not likely amenable to CT-guided drainage. Aspiration would not be indicated without placement of a catheter. He is feeling better overall and his exam shows less tenderness and when I last saw him last week. We discussed pain control when she was very upset about having it minimized. We discussed IV antibiotics for another 1-2 days. At this time he would be switched back to oral antibiotics. I discussed the risk of him recurring in the eating a colostomy but he was perfectly understanding of waiting another day or 2 on IV antibiotics. Patient voiced that he was happy with this discussion and explanation.

## 2017-03-20 NOTE — Progress Notes (Signed)
CC: Diverticulitis w abscess Subjective: Pain is better as compared to yesterday.  AVSS CT scan pers/ reviewed and d/w radiologist , small abscess low yield for IR drainage.  Objective: Vital signs in last 24 hours: Temp:  [98 F (36.7 C)-98.3 F (36.8 C)] 98 F (36.7 C) (10/15 1347) Pulse Rate:  [58-73] 73 (10/15 1347) Resp:  [20] 20 (10/15 0420) BP: (118-131)/(76-84) 127/84 (10/15 1347) SpO2:  [95 %-99 %] 99 % (10/15 1347) Last BM Date: 03/19/17  Intake/Output from previous day: 10/14 0701 - 10/15 0700 In: 1342 [I.V.:1292; IV Piggyback:50] Out: -  Intake/Output this shift: Total I/O In: 1247 [I.V.:1197; IV Piggyback:50] Out: -   Physical exam: NAD, awake and alert Abd: soft, mild TTP llw , no peritonitis Ext: no edema Lab Results: CBC   Recent Labs  03/20/17 0750  WBC 11.0*  HGB 15.2  HCT 45.1  PLT 250   BMET  Recent Labs  03/20/17 0750  NA 137  K 4.0  CL 102  CO2 27  GLUCOSE 99  BUN 7  CREATININE 0.88  CALCIUM 9.0   PT/INR No results for input(s): LABPROT, INR in the last 72 hours. ABG No results for input(s): PHART, HCO3 in the last 72 hours.  Invalid input(s): PCO2, PO2  Studies/Results: No results found.  Anti-infectives: Anti-infectives    Start     Dose/Rate Route Frequency Ordered Stop   03/17/17 0300  piperacillin-tazobactam (ZOSYN) IVPB 3.375 g     3.375 g 12.5 mL/hr over 240 Minutes Intravenous Every 8 hours 03/16/17 2252     03/16/17 1830  piperacillin-tazobactam (ZOSYN) IVPB 3.375 g     3.375 g 100 mL/hr over 30 Minutes Intravenous  Once 03/16/17 1820 03/16/17 2012      Assessment/Plan: Diverticulitis w abscess D/W radiologist who feels that the collection has not changed much and IR drainage would be low yield. D/W pt and family in detail. We agreed that if he continues to have pain we will rescan him again and be more aggressive in draining collection. For now continue A/BS and clears. No need for emergent surgery at  this time I spent 35 minutes in this encounter with the majority of time spent in coordination counseling of his care  Sterling Big, MD, [facility]  03/20/2017

## 2017-03-21 ENCOUNTER — Inpatient Hospital Stay: Payer: Self-pay

## 2017-03-21 LAB — BASIC METABOLIC PANEL
Anion gap: 9 (ref 5–15)
BUN: 6 mg/dL (ref 6–20)
CHLORIDE: 100 mmol/L — AB (ref 101–111)
CO2: 28 mmol/L (ref 22–32)
Calcium: 8.8 mg/dL — ABNORMAL LOW (ref 8.9–10.3)
Creatinine, Ser: 0.88 mg/dL (ref 0.61–1.24)
GFR calc Af Amer: >60 mL/min (ref >60)
GFR calc non Af Amer: >60 mL/min (ref >60)
Glucose, Bld: 104 mg/dL — ABNORMAL HIGH (ref 65–99)
POTASSIUM: 3.4 mmol/L — AB (ref 3.5–5.1)
SODIUM: 137 mmol/L (ref 135–145)

## 2017-03-21 LAB — CBC
HCT: 43.4 % (ref 40.0–52.0)
HEMOGLOBIN: 14.9 g/dL (ref 13.0–18.0)
MCH: 30.3 pg (ref 26.0–34.0)
MCHC: 34.4 g/dL (ref 32.0–36.0)
MCV: 88 fL (ref 80.0–100.0)
Platelets: 239 10*3/uL (ref 150–440)
RBC: 4.93 MIL/uL (ref 4.40–5.90)
RDW: 14 % (ref 11.5–14.5)
WBC: 11.7 10*3/uL — ABNORMAL HIGH (ref 3.8–10.6)

## 2017-03-21 MED ORDER — IOPAMIDOL (ISOVUE-300) INJECTION 61%
100.0000 mL | Freq: Once | INTRAVENOUS | Status: AC | PRN
  Administered 2017-03-21: 100 mL via INTRAVENOUS

## 2017-03-21 MED ORDER — IOPAMIDOL (ISOVUE-300) INJECTION 61%
15.0000 mL | INTRAVENOUS | Status: AC
  Administered 2017-03-21 (×2): 15 mL via ORAL

## 2017-03-21 NOTE — Progress Notes (Signed)
Ct reviewed and d/w IR. Plan for to attempt Perd drain in am D/W pt in detail HOld any lovenox or heparin for procedure

## 2017-03-21 NOTE — Progress Notes (Signed)
CC: Diverticulitis Subjective: Continues to have some abdominal pain. White count is slightly elevated. No fevers Taking by mouth  Objective: Vital signs in last 24 hours: Temp:  [98 F (36.7 C)-98.5 F (36.9 C)] 98.4 F (36.9 C) (10/16 0451) Pulse Rate:  [73-99] 99 (10/16 0451) Resp:  [18] 18 (10/16 0451) BP: (127-150)/(82-96) 139/82 (10/16 0451) SpO2:  [96 %-99 %] 98 % (10/16 0451) Last BM Date: 03/20/17  Intake/Output from previous day: 10/15 0701 - 10/16 0700 In: 4502 [P.O.:780; I.V.:2649; IV Piggyback:1073] Out: 1900 [Urine:1900] Intake/Output this shift: Total I/O In: 976 [I.V.:950; IV Piggyback:26] Out: -   Physical exam: NAD Abd: soft, mild ttp LLQ, no peritonitis Ext : well perfused  Lab Results: CBC   Recent Labs  03/20/17 0750 03/21/17 0454  WBC 11.0* 11.7*  HGB 15.2 14.9  HCT 45.1 43.4  PLT 250 239   BMET  Recent Labs  03/20/17 0750 03/21/17 0454  NA 137 137  K 4.0 3.4*  CL 102 100*  CO2 27 28  GLUCOSE 99 104*  BUN 7 6  CREATININE 0.88 0.88  CALCIUM 9.0 8.8*   PT/INR No results for input(s): LABPROT, INR in the last 72 hours. ABG No results for input(s): PHART, HCO3 in the last 72 hours.  Invalid input(s): PCO2, PO2  Studies/Results: No results found.  Anti-infectives: Anti-infectives    Start     Dose/Rate Route Frequency Ordered Stop   03/17/17 0300  piperacillin-tazobactam (ZOSYN) IVPB 3.375 g     3.375 g 12.5 mL/hr over 240 Minutes Intravenous Every 8 hours 03/16/17 2252     03/16/17 1830  piperacillin-tazobactam (ZOSYN) IVPB 3.375 g     3.375 g 100 mL/hr over 30 Minutes Intravenous  Once 03/16/17 1820 03/16/17 2012      Assessment/Plan: CT A/P We will reassess the need for IR drain No emergent surgical  intervention required Sterling Big, MD, FACS  03/21/2017

## 2017-03-22 ENCOUNTER — Inpatient Hospital Stay: Payer: Self-pay

## 2017-03-22 LAB — PROTIME-INR
INR: 1.03
Prothrombin Time: 13.4 seconds (ref 11.4–15.2)

## 2017-03-22 LAB — CBC
HEMATOCRIT: 42.3 % (ref 40.0–52.0)
Hemoglobin: 14.6 g/dL (ref 13.0–18.0)
MCH: 30.4 pg (ref 26.0–34.0)
MCHC: 34.5 g/dL (ref 32.0–36.0)
MCV: 88 fL (ref 80.0–100.0)
Platelets: 228 10*3/uL (ref 150–440)
RBC: 4.81 MIL/uL (ref 4.40–5.90)
RDW: 13.9 % (ref 11.5–14.5)
WBC: 8.6 10*3/uL (ref 3.8–10.6)

## 2017-03-22 LAB — APTT: APTT: 42 s — AB (ref 24–36)

## 2017-03-22 MED ORDER — FENTANYL CITRATE (PF) 100 MCG/2ML IJ SOLN
INTRAMUSCULAR | Status: AC
  Filled 2017-03-22: qty 2

## 2017-03-22 MED ORDER — FENTANYL CITRATE (PF) 100 MCG/2ML IJ SOLN
INTRAMUSCULAR | Status: AC | PRN
  Administered 2017-03-22: 25 ug via INTRAVENOUS
  Administered 2017-03-22: 50 ug via INTRAVENOUS
  Administered 2017-03-22 (×2): 25 ug via INTRAVENOUS
  Administered 2017-03-22: 50 ug via INTRAVENOUS
  Administered 2017-03-22: 25 ug via INTRAVENOUS

## 2017-03-22 MED ORDER — SODIUM CHLORIDE 0.9 % IV SOLN
INTRAVENOUS | Status: AC | PRN
  Administered 2017-03-22: 125 mL/h via INTRAVENOUS

## 2017-03-22 MED ORDER — MIDAZOLAM HCL 5 MG/5ML IJ SOLN
INTRAMUSCULAR | Status: AC
  Administered 2017-03-22: 15:00:00
  Filled 2017-03-22: qty 5

## 2017-03-22 MED ORDER — FENTANYL CITRATE (PF) 100 MCG/2ML IJ SOLN
INTRAMUSCULAR | Status: AC
  Administered 2017-03-22: 14:00:00
  Filled 2017-03-22: qty 4

## 2017-03-22 MED ORDER — MIDAZOLAM HCL 5 MG/5ML IJ SOLN
INTRAMUSCULAR | Status: AC | PRN
  Administered 2017-03-22 (×5): 1 mg via INTRAVENOUS

## 2017-03-22 MED ORDER — MIDAZOLAM HCL 5 MG/5ML IJ SOLN
INTRAMUSCULAR | Status: AC
  Filled 2017-03-22: qty 5

## 2017-03-22 NOTE — Progress Notes (Signed)
CC: Diverticulits Subjective: Still having some pain WBC improved. No NV  Objective: Vital signs in last 24 hours: Temp:  [98.1 F (36.7 C)-98.8 F (37.1 C)] 98.1 F (36.7 C) (10/17 0439) Pulse Rate:  [71-89] 78 (10/17 0439) Resp:  [19-20] 19 (10/17 0439) BP: (140-144)/(87-95) 140/87 (10/17 0439) SpO2:  [96 %-100 %] 100 % (10/17 0439) Last BM Date: 03/20/17  Intake/Output from previous day: 10/16 0701 - 10/17 0700 In: 4347 [P.O.:620; I.V.:3551; IV Piggyback:176] Out: 2000 [Urine:2000] Intake/Output this shift: Total I/O In: 311 [I.V.:311] Out: -   Physical exam: NAD, alert Abd: soft, Mild TTP LLQ, no peritonitis Ext: no edema and well perfused Lab Results: CBC   Recent Labs  03/21/17 0454 03/22/17 0303  WBC 11.7* 8.6  HGB 14.9 14.6  HCT 43.4 42.3  PLT 239 228   BMET  Recent Labs  03/20/17 0750 03/21/17 0454  NA 137 137  K 4.0 3.4*  CL 102 100*  CO2 27 28  GLUCOSE 99 104*  BUN 7 6  CREATININE 0.88 0.88  CALCIUM 9.0 8.8*   PT/INR  Recent Labs  03/22/17 0303  LABPROT 13.4  INR 1.03   ABG No results for input(s): PHART, HCO3 in the last 72 hours.  Invalid input(s): PCO2, PO2  Studies/Results: Ct Abdomen Pelvis W Contrast  Result Date: 03/21/2017 CLINICAL DATA:  Follow-up diverticulitis. Patient is being managed conservatively with antibiotics. EXAM: CT ABDOMEN AND PELVIS WITH CONTRAST TECHNIQUE: Multidetector CT imaging of the abdomen and pelvis was performed using the standard protocol following bolus administration of intravenous contrast. CONTRAST:  100mL ISOVUE-300 IOPAMIDOL (ISOVUE-300) INJECTION 61% COMPARISON:  03/16/2017 FINDINGS: Lower chest: No acute abnormality. Hepatobiliary: No focal liver abnormality is seen. No gallstones, gallbladder wall thickening, or biliary dilatation. Pancreas: Unremarkable. No pancreatic ductal dilatation or surrounding inflammatory changes. Spleen: Normal in size without focal abnormality. Adrenals/Urinary  Tract: Adrenal glands are unremarkable. Kidneys are normal, without renal calculi, focal lesion, or hydronephrosis. Bladder wall thickening near the dome of the bladder likely reactive secondary to adjacent peridiverticular abscess. Stomach/Bowel: Diverticulosis of the sigmoid colon. Sigmoid colon wall thickening and mild surrounding inflammatory changes consistent with acute diverticulitis. Along the mesenteric aspect there is a 2 x 3.8 cm complex fluid collection with a small air-fluid level and surrounding inflammatory changes consistent with an abscess with the distal aspect in close proximity to the dome of the bladder. Vascular/Lymphatic: No significant vascular findings are present. No enlarged abdominal or pelvic lymph nodes. Reproductive: Prostate is unremarkable. Other: Small umbilical hernia containing fat.  No inguinal hernia. Musculoskeletal: No acute osseous abnormality. No lytic or sclerotic osseous lesion. IMPRESSION: 1. Stable sigmoid diverticulitis. Stable 3.8 x 2 cm abscess along the mesenteric aspect of the sigmoid colon with surrounding inflammatory changes and reactive bladder wall thickening along the dome of the bladder. Electronically Signed   By: Elige KoHetal  Patel   On: 03/21/2017 10:29    Anti-infectives: Anti-infectives    Start     Dose/Rate Route Frequency Ordered Stop   03/17/17 0300  piperacillin-tazobactam (ZOSYN) IVPB 3.375 g     3.375 g 12.5 mL/hr over 240 Minutes Intravenous Every 8 hours 03/16/17 2252     03/16/17 1830  piperacillin-tazobactam (ZOSYN) IVPB 3.375 g     3.375 g 100 mL/hr over 30 Minutes Intravenous  Once 03/16/17 1820 03/16/17 2012      Assessment/Plan: Diverticulitis w small abscess, IR to attempt drainage. If he continues to have pain we may need to surgical intervention while inpatient ( + /  _ colostomy) No emergent surgical intervention required right now  Sterling Big, MD, FACS  03/22/2017

## 2017-03-22 NOTE — Procedures (Signed)
Interventional Radiology Procedure Note  Procedure: Placement of a 21F drain into the small air collection near the inflamed sigmoid colon.  Aspiration yields 1 mL bloody fluid.   Complications: None  Estimated Blood Loss: None  Recommendations: - Drain to JP bulb - Continue abx - Repeat imaging prior to drain removal  Signed,  Sterling BigHeath K. Aldeen Riga, MD

## 2017-03-23 NOTE — Progress Notes (Signed)
Text paged Dr. Excell Seltzerooper for this surgical patient c/o a popping feeling at the JP drain site. Awaiting call back.

## 2017-03-23 NOTE — Progress Notes (Signed)
CC: Diverticulitis Subjective: Pain only at JP site Taking Po  Objective: Vital signs in last 24 hours: Temp:  [97.8 F (36.6 C)-98.2 F (36.8 C)] 97.8 F (36.6 C) (10/18 0504) Pulse Rate:  [55-87] 69 (10/18 0504) Resp:  [11-19] 19 (10/18 0504) BP: (116-147)/(73-101) 121/77 (10/18 0504) SpO2:  [96 %-100 %] 97 % (10/18 0504) Last BM Date: 03/20/17  Intake/Output from previous day: 10/17 0701 - 10/18 0700 In: 2050 [P.O.:240; I.V.:1745; IV Piggyback:65] Out: 1865 [Urine:1850; Drains:15] Intake/Output this shift: Total I/O In: 2483 [P.O.:360; I.V.:2083; IV Piggyback:40] Out: -   Physical exam: NAD, alert Abd: soft, minimal LLQ tenderness, no peritonitis. Serosanguinous drainage fro, JP Ext: no edema  And well perfused  Lab Results: CBC   Recent Labs  03/21/17 0454 03/22/17 0303  WBC 11.7* 8.6  HGB 14.9 14.6  HCT 43.4 42.3  PLT 239 228   BMET  Recent Labs  03/21/17 0454  NA 137  K 3.4*  CL 100*  CO2 28  GLUCOSE 104*  BUN 6  CREATININE 0.88  CALCIUM 8.8*   PT/INR  Recent Labs  03/22/17 0303  LABPROT 13.4  INR 1.03   ABG No results for input(s): PHART, HCO3 in the last 72 hours.  Invalid input(s): PCO2, PO2  Studies/Results: Ct Image Guided Drainage By Percutaneous Catheter  Result Date: 03/22/2017 INDICATION: 31 year old male with persistent diverticulitis and evidence of small micro perforation within extraluminal air collection. Due to the persistence of these findings over multiple studies despite antibiotic therapy, an attempted placement of a percutaneous drainage catheter is warranted. EXAM: CT GUIDED DRAINAGE OF  ABSCESS MEDICATIONS: The patient is currently admitted to the hospital and receiving intravenous antibiotics. The antibiotics were administered within an appropriate time frame prior to the initiation of the procedure. ANESTHESIA/SEDATION: 5 mg IV Versed 200 mcg IV Fentanyl Moderate Sedation Time:  30 minutes The patient was  continuously monitored during the procedure by the interventional radiology nurse under my direct supervision. COMPLICATIONS: None immediate. TECHNIQUE: Informed written consent was obtained from the patient after a thorough discussion of the procedural risks, benefits and alternatives. All questions were addressed. A timeout was performed prior to the initiation of the procedure. PROCEDURE: A planning axial CT scan was performed. The very small gas locule adjacent to the inflamed sigmoid colon was successfully identified. A suitable skin entry site was selected and marked. The region was sterilely prepped and draped in standard fashion using chlorhexidine skin prep. Local anesthesia was attained by infiltration with 1% lidocaine. A small dermatotomy was made. Under intermittent CT guidance, an 18 gauge trocar needle was carefully advanced into the gas collection. An Amplatz wire was then advanced through the needle. CT imaging confirms that this is coiling in the region of the gas. Therefore, the decision was made to proceed with drain placement. The skin tract was dilated to 10 [place] and a 10.2 [place] cook all-purpose drainage catheter was advanced over the wire and formed. Aspiration yields 1 mL of bloody fluid. Post drain placement CT imaging demonstrates the drainage catheter in excellent position. FINDINGS: Successful placement of a 1210 French percutaneous drain into the extraluminal gas collection adjacent to the inflamed sigmoid colon. IMPRESSION: Successful placement of a 10 French percutaneous drain into the extraluminal gas collection adjacent to the inflamed sigmoid colon. Signed, Sterling BigHeath K. McCullough, MD [facility] Electronically Signed   By: Malachy MoanHeath  McCullough M.D.   On: 03/22/2017 16:57    Anti-infectives: Anti-infectives    Start  Dose/Rate Route Frequency Ordered Stop   03/17/17 0300  piperacillin-tazobactam (ZOSYN) IVPB 3.375 g      3.375 g 12.5 mL/hr over 240 Minutes Intravenous Every 8 hours 03/16/17 2252     03/16/17 1830  piperacillin-tazobactam (ZOSYN) IVPB 3.375 g     3.375 g 100 mL/hr over 30 Minutes Intravenous  Once 03/16/17 1820 03/16/17 2012      Assessment/Plan: Doing well Continue drain and A/bs Advance diet heplock ivf No surgical intervention at this time  DC in am  Sterling Big, MD, FACS  03/23/2017

## 2017-03-23 NOTE — Progress Notes (Signed)
NO call back from Dr. Excell Seltzerooper. Call placed to [facility] doctor Anne HahnWillis who referred writer again to Surgeon Dr. Excell Seltzerooper. Now patient's is resting after several doses of pain medication. Will continue to monitor and endorse.

## 2017-03-24 MED ORDER — CIPROFLOXACIN HCL 500 MG PO TABS: 500.0000 mg | ORAL_TABLET | Freq: Two times a day (BID) | ORAL | 0 refills | Status: DC

## 2017-03-24 MED ORDER — AMOXICILLIN-POT CLAVULANATE 875-125 MG PO TABS: 1.0000 | ORAL_TABLET | Freq: Two times a day (BID) | ORAL | 0 refills | Status: DC

## 2017-03-24 MED ORDER — OXYCODONE-ACETAMINOPHEN 5-325 MG PO TABS: 1.0000 | ORAL_TABLET | ORAL | 0 refills | Status: DC | PRN

## 2017-03-24 MED ORDER — METRONIDAZOLE 500 MG PO TABS: 500.0000 mg | ORAL_TABLET | Freq: Three times a day (TID) | ORAL | 0 refills | Status: DC

## 2017-03-24 NOTE — Progress Notes (Signed)
IV was removed. Discharge instructions, follow-up appointments, and prescriptions were provided to the pt. Drain education was provided to the pt. All questions were answered. The pt refused a wheelchair and walked downstairs.

## 2017-03-24 NOTE — Discharge Summary (Signed)
Patient ID: Christopher Alvarez MRN: [id number] DOB/AGE: 1986-05-25 31 y.o.  Admit date: 03/16/2017 Discharge date: 03/24/2017   Discharge Diagnoses:  Active Problems:   Abdominal pain   Procedures:IR drain placement  Hospital Course: Christopher Alvarez is a 31 year old male with a history of r outpatient therapy. He was admitted to hospital for IV antibiotics and repeat CT scan. The CT scan shows small colle He did have persistent pain and given his symptoms  A  Ct was repeated, SHOWING A PERSISTENT COLLECTion , IR WAS ABLE TO PLace A DRAIN WITHIN THE CAVITY. His condition improved and at the time of discharge she was ambulating, tolerating diet and he was afebrile with vital signs stable.  His physical exam showed a male in no acute distress. Awake and alert. Abdomen: Soft non tender drain in place with serosanguinous fluid.No peritonitis. Extremities: Well perfused and no edema. condition at the  time of discharge is stable    Disposition: 01-Home or Self Care  Discharge Instructions    Call MD for:    Complete by:  As directed    Call MD for:  difficulty breathing, headache or visual disturbances    Complete by:  As directed    Call MD for:  extreme fatigue    Complete by:  As directed    Call MD for:  hives    Complete by:  As directed    Call MD for:  persistant dizziness or light-headedness    Complete by:  As directed    Call MD for:  persistant nausea and vomiting    Complete by:  As directed    Call MD for:  redness, tenderness, or signs of infection (pain, swelling, redness, odor or green/yellow discharge around incision site)    Complete by:  As directed    Call MD for:  severe uncontrolled pain    Complete by:  As directed    Call MD for:  temperature >100.4    Complete by:  As directed    Diet - low sodium heart healthy    Complete by:  As directed    Increase activity slowly    Complete by:  As directed      Allergies as of 03/24/2017   No Known Allergies      Medication List    TAKE these medications   atomoxetine 40 MG capsule Commonly known as:  STRATTERA Take 1 capsule (40 mg total) by mouth daily. Take 1 daily for 2 weeks and then 2 daily   ciprofloxacin 500 MG tablet Commonly known as:  CIPRO Take 1 tablet (500 mg total) by mouth 2 (two) times daily. What changed:  when to take this   gabapentin 400 MG capsule Commonly known as:  NEURONTIN Take 2 capsules (800 mg total) by mouth 3 (three) times daily. Take 2 capsules 3 times daily   metoprolol succinate 25 MG 24 hr tablet Commonly known as:  TOPROL-XL Take 1 tablet (25 mg total) by mouth daily.   metroNIDAZOLE 500 MG tablet Commonly known as:  FLAGYL Take 1 tablet (500 mg total) by mouth 3 (three) times daily.   oxyCODONE-acetaminophen 5-325 MG tablet Commonly known as:  ROXICET Take 1-2 tablets by mouth every 4 (four) hours as needed for severe pain. What changed:  Another medication with the same name was added. Make sure you understand how and when to take each.   oxyCODONE-acetaminophen 5-325 MG tablet Commonly known as:  PERCOCET/ROXICET Take 1-2 tablets by mouth every 4 (four) hours  as needed for severe pain. What changed:  You were already taking a medication with the same name, and this prescription was added. Make sure you understand how and when to take each.      Follow-up Information    Christopher Alvarez, Christopher Evan, MD. Go on 04/07/2017.   Specialty:  General Surgery Why:  Friday at 8:45am for hospital follow-up Contact information: [address] [place] [phone]            Sterling Bigiego Cipriana Biller, MD FACS

## 2017-03-24 NOTE — Care Management (Signed)
Patient discharge on oral antibiotics.  To pick up at his pharmacy [address], [address].  Patient provided with application to Medication Management, [facility], and "The Network:  Your Guide [facility] [employer] [facility] [place]"  Booklet

## 2017-03-27 ENCOUNTER — Telehealth: Payer: Self-pay

## 2017-03-27 NOTE — Telephone Encounter (Signed)
Post-op call made to patient at this time. Spoke with Christopher Alvarez. Post-op interview questions below.  1. How are you feeling? Doing well  2. Is your pain controlled? yes  3. What are you doing for the pain? Pain medicine  4. Are you having any Nausea or Vomiting? no  5. Are you having any Fever or Chills? no  6. Are you having any Constipation or Diarrhea? no  7. Is there any Swelling or Bruising you are concerned about? no  8. Do you have any questions or concerns at this time? no   Discussion: Not much drainage at this time. Patient doing well. Post op appointment reviewed and patient verbalized understanding.

## 2017-03-30 ENCOUNTER — Ambulatory Visit (INDEPENDENT_AMBULATORY_CARE_PROVIDER_SITE_OTHER): Payer: Self-pay | Admitting: Surgery

## 2017-03-30 ENCOUNTER — Encounter: Payer: Self-pay | Admitting: Surgery

## 2017-03-30 VITALS — BP 129/81 | HR 99 | Temp 98.4°F | Ht 69.0 in | Wt 226.0 lb

## 2017-03-30 DIAGNOSIS — K5730 Diverticulosis of large intestine without perforation or abscess without bleeding: Secondary | ICD-10-CM

## 2017-03-30 MED ORDER — AMOXICILLIN-POT CLAVULANATE 875-125 MG PO TABS: 1.0000 | ORAL_TABLET | Freq: Two times a day (BID) | ORAL | 0 refills | Status: AC

## 2017-03-30 NOTE — Progress Notes (Signed)
Outpatient Surgical Follow Up  03/30/2017  Christopher Alvarez is an 31 y.o. male.   CC: Acute diverticulitis  HPI: This patient with acute diverticulitis.  He has had 2 admissions to the hospital and subsequent drainage procedure performed by IR.  He is here for follow-up today.  His drain is put out very scant amounts less than 3 cc in 36 hours.  He denies fevers or chills and is eating well.  Past Medical History:  Diagnosis Date  . Diverticulitis   . Hypertension     No past surgical history on file.  No family history on file.  Social History:  reports that he has been smoking Cigarettes.  He has been smoking about 1.00 pack per day. He has never used smokeless tobacco. He reports that he does not drink alcohol or use drugs.  Allergies: No Known Allergies  Medications reviewed.   Review of Systems:   Review of Systems  Constitutional: Negative.   HENT: Negative.   Eyes: Negative.   Respiratory: Negative.   Cardiovascular: Negative.   Gastrointestinal: Negative for abdominal pain, blood in stool, constipation, diarrhea, heartburn, nausea and vomiting.  Genitourinary: Negative.   Musculoskeletal: Negative.   Skin: Negative.   Neurological: Negative.   Endo/Heme/Allergies: Negative.   Psychiatric/Behavioral: Negative.      Physical Exam:  There were no vitals taken for this visit.  Physical Exam  Constitutional: He is oriented to person, place, and time and well-developed, well-nourished, and in no distress. No distress.  HENT:  Head: Normocephalic.  Mouth/Throat: No oropharyngeal exudate.  Eyes: Pupils are equal, round, and reactive to light. Right eye exhibits no discharge. Left eye exhibits no discharge. No scleral icterus.  Neck: No JVD present.  Cardiovascular: Normal rate and regular rhythm.   Pulmonary/Chest: Effort normal. No respiratory distress.  Abdominal: Soft. He exhibits no distension. There is no tenderness. There is no rebound.  Scant amount of  serosanguineous fluid in the drain container.  Drain is removed without difficulty and I believe that it was actually in the subcutaneous space.  Musculoskeletal: Normal range of motion. He exhibits no edema.  Lymphadenopathy:    He has no cervical adenopathy.  Neurological: He is alert and oriented to person, place, and time.  Skin: Skin is warm and dry. No rash noted. He is not diaphoretic. No erythema.  Psychiatric: Mood and affect normal.  Vitals reviewed.     No results found for this or any previous visit (from the past 48 hour(s)). No results found.  Assessment/Plan:  Drain is removed.  I believe that it was had pulled back into the subcutaneous space.  He is doing quite well at this time without signs of active acute diverticulitis.  I recommended that he stay on oral antibiotics and I see him in 2 weeks.  We will arrange for outpatient colonoscopy in the next few weeks as well.  He will then require surgical intervention as this is recurred multiple times.  Christopher Hawichard E Naif Alabi, MD, FACS

## 2017-03-30 NOTE — Patient Instructions (Signed)
WE will send the referral to [facility] for you to have a Colonoscopy. Someone from their office will call you to schedule an appointment.  We have sent a refill to your pharmacy for the Antibiotic. Please finish the medcine you currently have then start on the other.   We have removed your drain today. You will need to keep the drain site covered as long as you have drainage coming from the site.  Please see your follow up appointment listed below.

## 2017-04-07 ENCOUNTER — Inpatient Hospital Stay: Payer: Self-pay | Admitting: Surgery

## 2017-04-13 ENCOUNTER — Ambulatory Visit: Payer: Self-pay | Admitting: Gastroenterology

## 2017-04-17 ENCOUNTER — Encounter: Payer: Self-pay | Admitting: Surgery

## 2017-04-18 ENCOUNTER — Telehealth: Payer: Self-pay | Admitting: General Practice

## 2017-04-18 NOTE — Telephone Encounter (Signed)
patients mother called and was telling me that the nurses at the hospital would never test her son for C-Diff, her son came into our office to follow up with Dr. Excell Seltzerooper, patients mother said she asked once again to one of the nurses in our offuce if her son could be tested for C-Diff, and the nurse here in the office did the same thing as the nurses in the hospital, but couldn't tell me the nurses name. Patients mother said they finally decided to test her son for C-Diff before he had surgery, and her son tested positive for C-Diff, and then patient couldn't have his surgery, the mother said she was very upset with the whole situation. She called today and cancelled her son's appointment with Dr. Excell Seltzerooper on 11/14, and said [facility] surgery there, nothing against Dr. Excell Seltzerooper, said he was an amazing doctor, but felt like her son needed to go somewhere else.

## 2017-04-19 ENCOUNTER — Ambulatory Visit: Payer: Self-pay | Admitting: Surgery

## 2017-04-24 HISTORY — PX: COLECTOMY: SHX59

## 2017-05-28 ENCOUNTER — Emergency Department: Payer: Self-pay

## 2017-05-28 ENCOUNTER — Emergency Department
Admission: EM | Admit: 2017-05-28 | Discharge: 2017-05-28 | Disposition: A | Discharge status: Home / Self Care | Payer: Self-pay | Attending: Emergency Medicine | Admitting: Emergency Medicine

## 2017-05-28 ENCOUNTER — Encounter: Payer: Self-pay | Admitting: Emergency Medicine

## 2017-05-28 ENCOUNTER — Other Ambulatory Visit: Payer: Self-pay

## 2017-05-28 DIAGNOSIS — F17210 Nicotine dependence, cigarettes, uncomplicated: Secondary | ICD-10-CM | POA: Insufficient documentation

## 2017-05-28 DIAGNOSIS — I10 Essential (primary) hypertension: Secondary | ICD-10-CM | POA: Insufficient documentation

## 2017-05-28 DIAGNOSIS — F17290 Nicotine dependence, other tobacco product, uncomplicated: Secondary | ICD-10-CM | POA: Insufficient documentation

## 2017-05-28 DIAGNOSIS — R109 Unspecified abdominal pain: Secondary | ICD-10-CM

## 2017-05-28 DIAGNOSIS — R1032 Left lower quadrant pain: Secondary | ICD-10-CM | POA: Insufficient documentation

## 2017-05-28 DIAGNOSIS — Z79899 Other long term (current) drug therapy: Secondary | ICD-10-CM | POA: Insufficient documentation

## 2017-05-28 LAB — URINALYSIS, COMPLETE (UACMP) WITH MICROSCOPIC
BACTERIA UA: NONE SEEN
BILIRUBIN URINE: NEGATIVE
Glucose, UA: NEGATIVE mg/dL
HGB URINE DIPSTICK: NEGATIVE
KETONES UR: NEGATIVE mg/dL
LEUKOCYTES UA: NEGATIVE
Nitrite: NEGATIVE
Protein, ur: NEGATIVE mg/dL
SQUAMOUS EPITHELIAL / LPF: NONE SEEN
Specific Gravity, Urine: 1.025 (ref 1.005–1.030)
pH: 6 (ref 5.0–8.0)

## 2017-05-28 LAB — COMPREHENSIVE METABOLIC PANEL
ALT: 22 U/L (ref 17–63)
ANION GAP: 11 (ref 5–15)
AST: 20 U/L (ref 15–41)
Albumin: 4.4 g/dL (ref 3.5–5.0)
Alkaline Phosphatase: 56 U/L (ref 38–126)
BUN: 16 mg/dL (ref 6–20)
CHLORIDE: 105 mmol/L (ref 101–111)
CO2: 21 mmol/L — ABNORMAL LOW (ref 22–32)
Calcium: 9.4 mg/dL (ref 8.9–10.3)
Creatinine, Ser: 0.72 mg/dL (ref 0.61–1.24)
Glucose, Bld: 112 mg/dL — ABNORMAL HIGH (ref 65–99)
POTASSIUM: 4.1 mmol/L (ref 3.5–5.1)
Sodium: 137 mmol/L (ref 135–145)
Total Bilirubin: 0.6 mg/dL (ref 0.3–1.2)
Total Protein: 7.5 g/dL (ref 6.5–8.1)

## 2017-05-28 LAB — LIPASE, BLOOD: LIPASE: 22 U/L (ref 11–51)

## 2017-05-28 LAB — CBC
HEMATOCRIT: 46.6 % (ref 40.0–52.0)
HEMOGLOBIN: 15.5 g/dL (ref 13.0–18.0)
MCH: 30 pg (ref 26.0–34.0)
MCHC: 33.3 g/dL (ref 32.0–36.0)
MCV: 90 fL (ref 80.0–100.0)
Platelets: 241 10*3/uL (ref 150–440)
RBC: 5.17 MIL/uL (ref 4.40–5.90)
RDW: 14.6 % — ABNORMAL HIGH (ref 11.5–14.5)
WBC: 7.7 10*3/uL (ref 3.8–10.6)

## 2017-05-28 MED ORDER — MORPHINE SULFATE (PF) 4 MG/ML IV SOLN
4.0000 mg | Freq: Once | INTRAVENOUS | Status: AC
  Administered 2017-05-28: 4 mg via INTRAVENOUS
  Filled 2017-05-28: qty 1

## 2017-05-28 MED ORDER — IOPAMIDOL (ISOVUE-370) INJECTION 76%
75.0000 mL | Freq: Once | INTRAVENOUS | Status: AC | PRN
  Administered 2017-05-28: 75 mL via INTRAVENOUS

## 2017-05-28 MED ORDER — IOPAMIDOL (ISOVUE-300) INJECTION 61%
15.0000 mL | INTRAVENOUS | Status: AC
  Administered 2017-05-28 (×2): 15 mL via ORAL

## 2017-05-28 MED ORDER — ONDANSETRON HCL 4 MG/2ML IJ SOLN
4.0000 mg | Freq: Once | INTRAMUSCULAR | Status: AC
  Administered 2017-05-28: 4 mg via INTRAVENOUS
  Filled 2017-05-28: qty 2

## 2017-05-28 MED ORDER — SODIUM CHLORIDE 0.9 % IV BOLUS (SEPSIS)
1000.0000 mL | Freq: Once | INTRAVENOUS | Status: AC
  Administered 2017-05-28: 1000 mL via INTRAVENOUS

## 2017-05-28 NOTE — ED Notes (Signed)
Patient given contrast to drink by CT tech

## 2017-05-28 NOTE — ED Triage Notes (Signed)
Pt here today for LLQ abdominal pain. Reports had partial colectomy mid November for diverticulitis.  Was watching friends dog and dog got out. Spent 6 hrs walking around looking for dog and then today started with abdominal pain.  Nausea without vomiting or diarrhea. Did also have cdif prior to surgery. No fevers. ambulatory without difficulty. VSS

## 2017-05-28 NOTE — ED Notes (Signed)
Patient transported to CT 

## 2017-05-28 NOTE — ED Notes (Signed)
Patient requested more pain medications, notified ED provider

## 2017-05-28 NOTE — ED Notes (Signed)

## 2017-05-28 NOTE — ED Provider Notes (Signed)
[facility] Emergency Department Provider Note  ____________________________________________   First MD Initiated Contact with Patient 05/28/17 1301     (approximate)  I have reviewed the triage vital signs and the nursing notes.   HISTORY  Chief Complaint Abdominal Pain   HPI Christopher Alvarez is a 31 y.o. male with a history of diverticulitis with recent colectomy who is presenting with left lower quadrant abdominal pain.  He says that he was out yesterday looking for dog in the woods for 6 hours.  He says that he was supposed to watch his friend's dog but the dog escaped and he went in the woods looking for the dog.  He says that the pain worsened after this increase in physical activity.  He is denying any nausea, vomiting or diarrhea.  Denies any fever.  [facility].   Past Medical History:  Diagnosis Date  . Anxiety   . Diverticulitis   . Drug addiction (HCC)   . History of concussion   . Hypertension     Patient Active Problem List   Diagnosis Date Noted  . Abdominal pain 03/16/2017  . Diverticulitis of large intestine with abscess without bleeding   . Acute diverticulitis 03/07/2017  . Attention deficit hyperactivity disorder (ADHD), combined type 04/17/2015  . Chronic anxiety 02/23/2015  . Substance abuse (HCC) 01/21/2015    Past Surgical History:  Procedure Laterality Date  . PARTIAL COLECTOMY      Prior to Admission medications   Medication Sig Start Date End Date Taking? Authorizing Provider  atomoxetine (STRATTERA) 40 MG capsule Take 1 capsule (40 mg total) by mouth daily. Start with 1 QD for 3 days and then take 2 QD. Patient not taking: Reported on 06/19/2015 04/17/15   Gabriel CirriWicker, Cheryl, NP  clonazePAM (KLONOPIN) 1 MG tablet Take 1 tablet (1 mg total) by mouth 2 (two) times daily as needed for anxiety. 06/19/15   Gabriel CirriWicker, Cheryl, NP  gabapentin (NEURONTIN) 400 MG capsule Take 1 capsule (400  mg total) by mouth 3 (three) times daily. 06/19/15   Gabriel CirriWicker, Cheryl, NP  Melatonin 3 MG TABS Take by mouth. Takes 2 to 3 tablets per night    [provider]  metoprolol succinate (TOPROL-XL) 50 MG 24 hr tablet Take 1 tablet (50 mg total) by mouth daily. Take with or immediately following a meal. 10/02/15   Gabriel CirriWicker, Cheryl, NP  oxyCODONE (OXY IR/ROXICODONE) 5 MG immediate release tablet Take 1 tablet (5 mg total) by mouth every 4 (four) hours as needed for severe pain. 06/28/15   Darien RamusKaminski, Chong January W, MD    Allergies Patient has no known allergies.  Family History  Problem Relation Age of Onset  . Hypothyroidism Mother   . Thrombocytopenia Father   . Other Father        Thrombocytopenia  . Diabetes Paternal Grandmother   . Lung cancer Paternal Grandmother   . Diabetes Paternal Grandfather   . Lung cancer Paternal Grandfather   . Diabetes Father   . Thyroid disease Mother   . Alzheimer's disease Maternal Grandfather   . Cancer Paternal Grandmother        breast  . Dementia Paternal Grandmother     Social History Social History   Tobacco Use  . Smoking status: Current Every Day Smoker    Packs/day: 1.00    Types: Cigarettes, E-cigarettes  . Smokeless tobacco: Never Used  Substance Use Topics  . Alcohol use: No  Alcohol/week: 0.0 oz  . Drug use: No    Comment: Recovering cocaine, benzodiazepines and opiate addiction    Review of Systems  Constitutional: No fever/chills Eyes: No visual changes. ENT: No sore throat. Cardiovascular: Denies chest pain. Respiratory: Denies shortness of breath. Gastrointestinal: No nausea, no vomiting.  No diarrhea.  No constipation. Genitourinary: Negative for dysuria. Musculoskeletal: Negative for back pain. Skin: Negative for rash. Neurological: Negative for headaches, focal weakness or numbness.   ____________________________________________   PHYSICAL EXAM:  VITAL SIGNS: ED Triage Vitals [05/28/17 1150]  Enc Vitals Group      BP (!) 156/106     Pulse Rate 84     Resp 16     Temp 98.1 F (36.7 C)     Temp Source Oral     SpO2 99 %     Weight 215 lb (97.5 kg)     Height 5\' 9"  (1.753 m)     Head Circumference      Peak Flow      Pain Score 7     Pain Loc      Pain Edu?      Excl. in GC?     Constitutional: Alert and oriented. Well appearing and in no acute distress. Eyes: Conjunctivae are normal.  Head: Atraumatic. Nose: No congestion/rhinnorhea. Mouth/Throat: Mucous membranes are moist.  Neck: No stridor.   Cardiovascular: Normal rate, regular rhythm. Grossly normal heart sounds.   Respiratory: Normal respiratory effort.  No retractions. Lungs CTAB. Gastrointestinal: Soft with mild diffuse tenderness to palpation which is worse the left lower quadrant.  There is a well-healed midline surgical scar without any dehiscence, no surrounding induration or pus. No distention. No CVA tenderness. Musculoskeletal: No lower extremity tenderness nor edema.  No joint effusions. Neurologic:  Normal speech and language. No gross focal neurologic deficits are appreciated. Skin:  Skin is warm, dry and intact. No rash noted. Psychiatric: Mood and affect are normal. Speech and behavior are normal.  ____________________________________________   LABS (all labs ordered are listed, but only abnormal results are displayed)  Labs Reviewed  COMPREHENSIVE METABOLIC PANEL - Abnormal; Notable for the following components:      Result Value   CO2 21 (*)    Glucose, Bld 112 (*)    All other components within normal limits  CBC - Abnormal; Notable for the following components:   RDW 14.6 (*)    All other components within normal limits  URINALYSIS, COMPLETE (UACMP) WITH MICROSCOPIC - Abnormal; Notable for the following components:   Color, Urine YELLOW (*)    APPearance CLEAR (*)    All other components within normal limits  LIPASE, BLOOD    ____________________________________________  EKG   ____________________________________________  RADIOLOGY  CT with minimally dilated small bowel loops in the left upper quadrant measuring up to 3.2 cm.  Consider mild ileus versus partial small bowel obstruction. ____________________________________________   PROCEDURES  Procedure(s) performed:   Procedures  Critical Care performed:   ____________________________________________   INITIAL IMPRESSION / ASSESSMENT AND PLAN / ED COURSE  Pertinent labs & imaging results that were available during my care of the patient were reviewed by me and considered in my medical decision making (see chart for details).  Differential diagnosis includes, but is not limited to, acute appendicitis, renal colic, testicular torsion, urinary tract infection/pyelonephritis, prostatitis,  epididymitis, diverticulitis, small bowel obstruction or ileus, colitis, abdominal aortic aneurysm, gastroenteritis, hernia, etc. As part of my medical decision making, I reviewed the following data within the  electronic MEDICAL RECORD NUMBER Old chart reviewed and Notes from prior ED visits  ----------------------------------------- 3:27 PM on 05/28/2017 -----------------------------------------  I also discussed the CAT scan with the surgeon on call here, Dr. Aleen Campi, who does not see any acute pathology.  The patient reports that he is able to eat and drink and has tolerated the p.o. contrast here.  He says that he had a bowel movement this morning and has been passing gas.  Unlikely to be acute issue requiring admission.  I discussed with the patient is decreasing his activity and resting.  It seems that he has been over exerting yesterday and this may have worsened irritation due to his recent surgery.  He is understanding of the plan willing to comply.  We also discussed returning for further evaluation for any worsening or concerning symptoms.       ____________________________________________   FINAL CLINICAL IMPRESSION(S) / ED DIAGNOSES  Abdominal pain.    NEW MEDICATIONS STARTED DURING THIS VISIT:  This SmartLink is deprecated. Use AVSMEDLIST instead to display the medication list for a patient.   Note:  This document was prepared using Dragon voice recognition software and may include unintentional dictation errors.     Myrna Blazer, MD 05/28/17 [phone]

## 2017-09-08 ENCOUNTER — Encounter: Payer: Self-pay | Admitting: Unknown Physician Specialty

## 2017-09-08 ENCOUNTER — Ambulatory Visit (INDEPENDENT_AMBULATORY_CARE_PROVIDER_SITE_OTHER): Payer: Self-pay | Admitting: Unknown Physician Specialty

## 2017-09-08 DIAGNOSIS — K760 Fatty (change of) liver, not elsewhere classified: Secondary | ICD-10-CM

## 2017-09-08 DIAGNOSIS — R1032 Left lower quadrant pain: Secondary | ICD-10-CM

## 2017-09-08 DIAGNOSIS — F321 Major depressive disorder, single episode, moderate: Secondary | ICD-10-CM

## 2017-09-08 DIAGNOSIS — F419 Anxiety disorder, unspecified: Secondary | ICD-10-CM

## 2017-09-08 MED ORDER — DULOXETINE HCL 30 MG PO CPEP: 30.0000 mg | ORAL_CAPSULE | Freq: Every day | ORAL | 3 refills | Status: DC

## 2017-09-08 NOTE — Assessment & Plan Note (Signed)
Start Duloxetine to try to treat anxiety, depression and pain.  However, encouraged going to [facility] for continued management

## 2017-09-08 NOTE — Assessment & Plan Note (Signed)
On CT.  ALT.AST are normal.  Encouraged weight loss through diet and exercise

## 2017-09-08 NOTE — Assessment & Plan Note (Addendum)
Persistent despite surgery for colectomy  5 months ago.  ER visits with normal CT exam.

## 2017-09-08 NOTE — Progress Notes (Signed)
BP 129/82   Pulse 92   Temp 98.8 F (37.1 C) (Oral)   Ht 5\' 8"  (1.727 m)   Wt 219 lb 3.2 oz (99.4 kg)   SpO2 93%   BMI 33.33 kg/m    Subjective:    Patient ID: Christopher Alvarez, male    DOB: May 14, 1986, 32 y.o.   MRN: [id number]  HPI: Christopher Alvarez is a 32 y.o. male  Chief Complaint  Patient presents with  . Anxiety  . Pain   Chart review: Pt has a significant history of complicated diverticulitis with multiple hospital admissions.  He had a partial colectomy on 04/24/17.    States he still has pain related to his surgery and thinks it's because he never had a chance to rest following surgery.  Difficult to stand up straight.  He admits to dependency on opiates and currently 10-30 mg/day.  Thinking of going to [facility] today.    States anxiety is difficult.  Sleeps just a few hours/night.  No motivation.  Pt states he had been on Celexa for a while but came off of it following surgery.  States he does not want something long term Depression screen [facility] 2/9 09/08/2017 02/23/2015  Decreased Interest 1 1  Down, Depressed, Hopeless 1 2  PHQ - 2 Score 2 3  Altered sleeping 2 1  Tired, decreased energy 2 1  Change in appetite 3 2  Feeling bad or failure about yourself  1 0  Trouble concentrating 2 1  Moving slowly or fidgety/restless 1 0  Suicidal thoughts 0 0  PHQ-9 Score 13 8   Liver Steatosis Chart review:  On CT at [facility].  ALT/AST normal 12/18  Relevant past medical, surgical, family and social history reviewed and updated as indicated. Interim medical history since our last visit reviewed. Allergies and medications reviewed and updated.  Review of Systems  Per HPI unless specifically indicated above     Objective:    BP 129/82   Pulse 92   Temp 98.8 F (37.1 C) (Oral)   Ht 5\' 8"  (1.727 m)   Wt 219 lb 3.2 oz (99.4 kg)   SpO2 93%   BMI 33.33 kg/m   Wt Readings from Last 3 Encounters:  09/08/17 219 lb 3.2 oz (99.4 kg)  05/28/17 215 lb (97.5 kg)  03/30/17 226 lb  (102.5 kg)    Physical Exam  Constitutional: He is oriented to person, place, and time. He appears well-developed and well-nourished. No distress.  HENT:  Head: Normocephalic and atraumatic.  Eyes: Conjunctivae and lids are normal. Right eye exhibits no discharge. Left eye exhibits no discharge. No scleral icterus.  Neck: Normal range of motion. Neck supple. No JVD present. Carotid bruit is not present.  Cardiovascular: Normal rate, regular rhythm and normal heart sounds.  Pulmonary/Chest: Effort normal and breath sounds normal. No respiratory distress.  Abdominal: Normal appearance. There is no splenomegaly or hepatomegaly.  Musculoskeletal: Normal range of motion.  Neurological: He is alert and oriented to person, place, and time.  Skin: Skin is warm, dry and intact. No rash noted. No pallor.  Psychiatric: He has a normal mood and affect. His behavior is normal. Judgment and thought content normal.    Results for orders placed or performed during the hospital encounter of 05/28/17  Lipase, blood  Result Value Ref Range   Lipase 22 11 - 51 U/L  Comprehensive metabolic panel  Result Value Ref Range   Sodium 137 135 - 145 mmol/L   Potassium  4.1 3.5 - 5.1 mmol/L   Chloride 105 101 - 111 mmol/L   CO2 21 (L) 22 - 32 mmol/L   Glucose, Bld 112 (H) 65 - 99 mg/dL   BUN 16 6 - 20 mg/dL   Creatinine, Ser 1.610.72 0.61 - 1.24 mg/dL   Calcium 9.4 8.9 - 09.610.3 mg/dL   Total Protein 7.5 6.5 - 8.1 g/dL   Albumin 4.4 3.5 - 5.0 g/dL   AST 20 15 - 41 U/L   ALT 22 17 - 63 U/L   Alkaline Phosphatase 56 38 - 126 U/L   Total Bilirubin 0.6 0.3 - 1.2 mg/dL   GFR calc non Af Amer >60 >60 mL/min   GFR calc Af Amer >60 >60 mL/min   Anion gap 11 5 - 15  CBC  Result Value Ref Range   WBC 7.7 3.8 - 10.6 K/uL   RBC 5.17 4.40 - 5.90 MIL/uL   Hemoglobin 15.5 13.0 - 18.0 g/dL   HCT 04.546.6 40.940.0 - 81.152.0 %   MCV 90.0 80.0 - 100.0 fL   MCH 30.0 26.0 - 34.0 pg   MCHC 33.3 32.0 - 36.0 g/dL   RDW 91.414.6 (H) 78.211.5 - 95.614.5  %   Platelets 241 150 - 440 K/uL  Urinalysis, Complete w Microscopic  Result Value Ref Range   Color, Urine YELLOW (A) YELLOW   APPearance CLEAR (A) CLEAR   Specific Gravity, Urine 1.025 1.005 - 1.030   pH 6.0 5.0 - 8.0   Glucose, UA NEGATIVE NEGATIVE mg/dL   Hgb urine dipstick NEGATIVE NEGATIVE   Bilirubin Urine NEGATIVE NEGATIVE   Ketones, ur NEGATIVE NEGATIVE mg/dL   Protein, ur NEGATIVE NEGATIVE mg/dL   Nitrite NEGATIVE NEGATIVE   Leukocytes, UA NEGATIVE NEGATIVE   RBC / HPF 0-5 0 - 5 RBC/hpf   WBC, UA 0-5 0 - 5 WBC/hpf   Bacteria, UA NONE SEEN NONE SEEN   Squamous Epithelial / LPF NONE SEEN NONE SEEN   Mucus PRESENT       Assessment & Plan:   Problem List Items Addressed This Visit      Unprioritized   Abdominal pain    Persistent despite surgery for colectomy  5 months ago.  ER visits with normal CT exam.        Chronic anxiety    Persistent.  On Celexa at one time      Relevant Medications   DULoxetine (CYMBALTA) 30 MG capsule   Depression, major, single episode, moderate (HCC)    Start Duloxetine to try to treat anxiety, depression and pain.  However, encouraged going to [facility] for continued management      Relevant Medications   DULoxetine (CYMBALTA) 30 MG capsule   Steatosis of liver    On CT.  ALT.AST are normal.  Encouraged weight loss through diet and exercise          Follow up plan: Return in about 6 weeks (around 10/20/2017).

## 2017-09-08 NOTE — Assessment & Plan Note (Signed)
Persistent.  On Celexa at one time

## 2017-10-20 ENCOUNTER — Ambulatory Visit: Payer: Self-pay | Admitting: Unknown Physician Specialty

## 2018-04-09 ENCOUNTER — Encounter: Payer: Self-pay | Admitting: Lab

## 2018-04-09 ENCOUNTER — Encounter: Payer: Self-pay | Admitting: Family Medicine

## 2018-04-09 ENCOUNTER — Ambulatory Visit: Payer: BLUE CROSS/BLUE SHIELD | Admitting: Family Medicine

## 2018-04-09 VITALS — BP 142/82 | HR 108 | Temp 98.3°F | Ht 67.0 in | Wt 236.0 lb

## 2018-04-09 DIAGNOSIS — D72829 Elevated white blood cell count, unspecified: Secondary | ICD-10-CM | POA: Diagnosis not present

## 2018-04-09 DIAGNOSIS — F419 Anxiety disorder, unspecified: Secondary | ICD-10-CM | POA: Diagnosis not present

## 2018-04-09 DIAGNOSIS — Z23 Encounter for immunization: Secondary | ICD-10-CM | POA: Diagnosis not present

## 2018-04-09 DIAGNOSIS — E559 Vitamin D deficiency, unspecified: Secondary | ICD-10-CM

## 2018-04-09 DIAGNOSIS — R1032 Left lower quadrant pain: Secondary | ICD-10-CM

## 2018-04-09 DIAGNOSIS — F902 Attention-deficit hyperactivity disorder, combined type: Secondary | ICD-10-CM

## 2018-04-09 DIAGNOSIS — G8929 Other chronic pain: Secondary | ICD-10-CM

## 2018-04-09 DIAGNOSIS — E538 Deficiency of other specified B group vitamins: Secondary | ICD-10-CM

## 2018-04-09 LAB — CBC WITH DIFFERENTIAL/PLATELET
BASOS ABS: 0.1 10*3/uL (ref 0.0–0.1)
Basophils Relative: 0.9 % (ref 0.0–3.0)
EOS ABS: 0.3 10*3/uL (ref 0.0–0.7)
Eosinophils Relative: 3.7 % (ref 0.0–5.0)
HCT: 43.6 % (ref 39.0–52.0)
Hemoglobin: 15 g/dL (ref 13.0–17.0)
Lymphocytes Relative: 33.4 % (ref 12.0–46.0)
Lymphs Abs: 2.6 10*3/uL (ref 0.7–4.0)
MCHC: 34.5 g/dL (ref 30.0–36.0)
MCV: 91.6 fl (ref 78.0–100.0)
MONOS PCT: 10 % (ref 3.0–12.0)
Monocytes Absolute: 0.8 10*3/uL (ref 0.1–1.0)
NEUTROS ABS: 4 10*3/uL (ref 1.4–7.7)
NEUTROS PCT: 52 % (ref 43.0–77.0)
PLATELETS: 294 10*3/uL (ref 150.0–400.0)
RBC: 4.76 Mil/uL (ref 4.22–5.81)
RDW: 13.5 % (ref 11.5–15.5)
WBC: 7.6 10*3/uL (ref 4.0–10.5)

## 2018-04-09 LAB — B12 AND FOLATE PANEL
Folate: 5.2 ng/mL — ABNORMAL LOW (ref >5.9)
Vitamin B-12: 489 pg/mL (ref 211–911)

## 2018-04-09 LAB — COMPREHENSIVE METABOLIC PANEL
ALK PHOS: 59 U/L (ref 39–117)
ALT: 25 U/L (ref 0–53)
AST: 25 U/L (ref 0–37)
Albumin: 4.3 g/dL (ref 3.5–5.2)
BILIRUBIN TOTAL: 0.5 mg/dL (ref 0.2–1.2)
BUN: 17 mg/dL (ref 6–23)
CO2: 27 meq/L (ref 19–32)
CREATININE: 0.82 mg/dL (ref 0.40–1.50)
Calcium: 9.8 mg/dL (ref 8.4–10.5)
Chloride: 101 mEq/L (ref 96–112)
GFR: 114.1 mL/min (ref >60.00)
GLUCOSE: 109 mg/dL — AB (ref 70–99)
Potassium: 5 mEq/L (ref 3.5–5.1)
Sodium: 136 mEq/L (ref 135–145)
TOTAL PROTEIN: 6.7 g/dL (ref 6.0–8.3)

## 2018-04-09 LAB — TSH: TSH: 1.24 u[IU]/mL (ref 0.35–4.50)

## 2018-04-09 LAB — VITAMIN D 25 HYDROXY (VIT D DEFICIENCY, FRACTURES): VITD: 23.35 ng/mL — ABNORMAL LOW (ref 30.00–100.00)

## 2018-04-09 MED ORDER — AMPHETAMINE-DEXTROAMPHETAMINE 10 MG PO TABS: 10.0000 mg | ORAL_TABLET | Freq: Two times a day (BID) | ORAL | 0 refills | Status: DC

## 2018-04-09 MED ORDER — HYDROXYZINE HCL 25 MG PO TABS: 25.0000 mg | ORAL_TABLET | Freq: Three times a day (TID) | ORAL | 1 refills | Status: AC | PRN

## 2018-04-09 NOTE — Progress Notes (Signed)
Subjective:    Patient ID: Christopher Alvarez, male    DOB: 08/23/1985, 32 y.o.   MRN: [id number]  HPI  Patient presents to clinic to establish care with new PCP.   Patient's main concern today is anxiety and ADHD.  He is having increased stress since life due to going through a divorce and noticed that his attention and focus have been lacking.  Patient has taken Adderall twice daily and was successful when taking this medication in the past, but wanted to see how he did without it.  Patient states he was doing well for a while but now that he has had increased stress in his life, it has been tougher and tougher to focus.  Patient also has chronic left lower quadrant abdominal pain.  It was getting worse a few weeks ago, so he went to emergency department for evaluation.  CT scan blood work were done and that revealed no acute issue.  Patient states the pain has improved, he was concerned due to having severe diverticulitis with abscess in the past that required partial colectomy.  Patient has never seen a gastroenterologist. WBC count little up in ER, 12.1, we will recheck today.    Past medical history, surgical history, family history and social history reviewed and updated in chart. Past Medical History:  Diagnosis Date  . Anxiety   . Diverticulitis   . Drug addiction (HCC)   . History of concussion   . Hypertension    Social History   Tobacco Use  . Smoking status: Current Every Day Smoker    Packs/day: 1.00    Types: Cigarettes, E-cigarettes  . Smokeless tobacco: Never Used  Substance Use Topics  . Alcohol use: No    Alcohol/week: 0.0 standard drinks   Past Surgical History:  Procedure Laterality Date  . COLECTOMY  04/24/2017  . PARTIAL COLECTOMY     Family History  Problem Relation Age of Onset  . Hypothyroidism Mother   . Thrombocytopenia Father   . Other Father        Thrombocytopenia  . Diabetes Paternal Grandmother   . Lung cancer Paternal Grandmother   .  Diabetes Paternal Grandfather   . Lung cancer Paternal Grandfather   . Diabetes Father   . Thyroid disease Mother   . Alzheimer's disease Maternal Grandfather   . Cancer Paternal Grandmother        breast  . Dementia Paternal Grandmother     Review of Systems  Constitutional: Negative for chills, fatigue and fever.  HENT: Negative for congestion, ear pain, sinus pain and sore throat.   Eyes: Negative.   Respiratory: Negative for cough, shortness of breath and wheezing.   Cardiovascular: Negative for chest pain, palpitations and leg swelling.  Gastrointestinal: Negative for diarrhea, nausea and vomiting. Some LLQ ABD pain. Genitourinary: Negative for dysuria, frequency and urgency.  Musculoskeletal: Negative for arthralgias and myalgias.  Skin: Negative for color change, pallor and rash.  Neurological: Negative for syncope, light-headedness and headaches.  Psychiatric/Behavioral: The patient is nervous/anxious.       Objective:   Physical Exam  Constitutional: He is oriented to person, place, and time. He appears well-nourished. No distress.  HENT:  Head: Normocephalic and atraumatic.  Eyes: Pupils are equal, round, and reactive to light. Conjunctivae and EOM are normal. No scleral icterus.  Neck: Neck supple. No tracheal deviation present.  Cardiovascular: Regular rhythm and normal heart sounds.  Pulmonary/Chest: Effort normal and breath sounds normal. No respiratory distress.  Abdominal: Soft.  Bowel sounds are normal.  Large scare from colectomy surgery. Mild LLQ tenderness with palpation.  Musculoskeletal: He exhibits no edema.  Gait normal.   Neurological: He is alert and oriented to person, place, and time. No cranial nerve deficit.  Skin: Skin is warm and dry. No pallor.  Psychiatric: He has a normal mood and affect. His behavior is normal.  Able to clearly express thoughts. Good eye contact. Well groomed. Has good support from family.   Nursing note and vitals  reviewed.     Vitals:   04/09/18 1310  BP: (!) 142/82  Pulse: (!) 108  Temp: 98.3 F (36.8 C)  SpO2: 95%   Assessment & Plan:   Anxiety and ADHD- Long discussion about different treatment options for anxiety, patient declines starting any sort of SSRI.  Patient did requests small amount of Xanax to help get him through, but I advised patient that I would prefer not to start him on Xanax as it can become addictive and patients become reliant on this medicine without even realizing.  Patient would like to get back on his Adderall for ADHD, does note that when his ADHD is under better control the stress level in his life seems to go down because he is able to accomplish his tasks.  [facility] PMP narcotic registry checked and is appropriate for Adderall prescription patient also signed a non-opioid narcotic contract agreement for Adderall prescription.   Chronic left lower quadrant pain-this could be related to scar tissue and adhesions related to colectomy surgery.  Due to patient having history of severe diverticulitis with abscess and need for colectomy I would like him to see GI for evaluation and see if he needs to have a colonoscopy.  Leukocytosis-white blood cell count was elevated slightly in the ER in mid October 2019, we will recheck CBC  CMP, vitamin D, B12, TSH also drawn in clinic today  Flu vaccine given  Return to clinic in 1 month for recheck after starting back on Adderall.

## 2018-04-11 MED ORDER — FOLIC ACID 400 MCG PO TABS: 400.0000 ug | ORAL_TABLET | Freq: Every day | ORAL | 1 refills | Status: AC

## 2018-04-11 MED ORDER — VITAMIN D3 50 MCG (2000 UT) PO CAPS: 2000.0000 [IU] | ORAL_CAPSULE | Freq: Every day | ORAL | 1 refills | Status: AC

## 2018-04-11 NOTE — Addendum Note (Signed)
Addended by: Leanora Cover on: 04/11/2018 09:00 AM   Modules accepted: Orders

## 2018-04-19 ENCOUNTER — Encounter: Payer: Self-pay | Admitting: Gastroenterology

## 2018-05-07 ENCOUNTER — Ambulatory Visit: Payer: BLUE CROSS/BLUE SHIELD | Admitting: Family Medicine

## 2018-05-07 ENCOUNTER — Encounter: Payer: Self-pay | Admitting: Family Medicine

## 2018-05-07 VITALS — BP 142/100 | HR 103 | Temp 98.4°F | Ht 68.0 in | Wt 221.4 lb

## 2018-05-07 DIAGNOSIS — F1120 Opioid dependence, uncomplicated: Secondary | ICD-10-CM | POA: Diagnosis not present

## 2018-05-07 MED ORDER — CLONIDINE HCL 0.1 MG PO TABS: ORAL_TABLET | ORAL | 0 refills | Status: AC

## 2018-05-07 NOTE — Progress Notes (Signed)
Subjective:    Patient ID: Christopher Alvarez, male    DOB: 04/21/1986, 32 y.o.   MRN: [id number]  HPI  Presents to clinic for follow up on anxiety and ADHD.  Patient states the Adderall did help him have better focus, but he does have another issue he would like to discuss.  Patient states he has had issues with opiate addiction in the past and has begun taking pills again.  Patient usually takes morphine based pills including morphine tablets, Dilaudid tablets, hydrocodone tablets.  Patient states he has been doing well for a long time almost 2 years, but when he was treated for diverticulitis/abdominal pain in the ER recently he believes getting some pain medicine opened up his desire for opiates again.  Patient states he has done a few different rehab programs in the past, usually will do a Suboxone taper down to get himself off of the opiates in addition to taking clonidine to help with the withdrawal side effects.  Patient states right now his anxiety seems okay, he is aware he needs help getting off of the pain meds and is willing to do what it takes to do this.  Patient Active Problem List   Diagnosis Date Noted  . Chronic LLQ pain 04/09/2018  . Depression, major, single episode, moderate (HCC) 09/08/2017  . Steatosis of liver 09/08/2017  . Abdominal pain 03/16/2017  . Diverticulitis of large intestine with abscess without bleeding   . Acute diverticulitis 03/07/2017  . Attention deficit hyperactivity disorder (ADHD), combined type 04/17/2015  . Chronic anxiety 02/23/2015   Social History   Tobacco Use  . Smoking status: Current Every Day Smoker    Packs/day: 1.00    Types: Cigarettes, E-cigarettes  . Smokeless tobacco: Never Used  Substance Use Topics  . Alcohol use: No    Alcohol/week: 0.0 standard drinks   Review of Systems  Constitutional: Negative for chills, fatigue and fever.  HENT: Negative for congestion, ear pain, sinus pain and sore throat.   Eyes: Negative.     Respiratory: Negative for cough, shortness of breath and wheezing.   Cardiovascular: Negative for chest pain, palpitations and leg swelling.  Gastrointestinal: Negative for abdominal pain, diarrhea, nausea and vomiting.  Genitourinary: Negative for dysuria, frequency and urgency.  Musculoskeletal: Negative for arthralgias and myalgias.  Skin: Negative for color change, pallor and rash.  Neurological: Negative for syncope, light-headedness and headaches.  Psychiatric/Behavioral: The patient is not nervous/anxious.       Objective:   Physical Exam  Constitutional: He is oriented to person, place, and time. No distress.  HENT:  Head: Normocephalic and atraumatic.  Eyes: Pupils are equal, round, and reactive to light. EOM are normal. No scleral icterus.  Neck: Normal range of motion. Neck supple. No tracheal deviation present.  Cardiovascular: Regular rhythm and normal heart sounds.  Pulmonary/Chest: Effort normal and breath sounds normal. No respiratory distress.  Abdominal: Soft. Bowel sounds are normal. He exhibits no distension. There is no tenderness.  Musculoskeletal: He exhibits no edema.  Neurological: He is alert and oriented to person, place, and time.  Skin: Skin is warm and dry. Capillary refill takes less than 2 seconds. He is not diaphoretic. No erythema. No pallor.  Psychiatric: He has a normal mood and affect. His behavior is normal.  Nursing note and vitals reviewed.     Vitals:   05/07/18 1602  BP: (!) 142/100  Pulse: (!) 103  Temp: 98.4 F (36.9 C)  SpO2: 96%   Assessment &  Plan:    A total of 25  minutes were spent face-to-face with the patient during this encounter and over half of that time was spent on counseling and coordination of care. The patient was counseled on treatment plans, going to ER if needed for withdrawal symptoms.   Opioid dependence- patient appears in no distress at this time.  Patient aware that he cannot to stop taking the medications  cold [place], he will go into withdrawal and this could be dangerous.  Patient aware of this, and he plans to going to treatment right away.  Patient states he was able to get some Suboxone and has a supply that will allow him to not withdraw & also has opioid tablets at home.  Patient aware that I do not prescribe Suboxone but I can refer him to someone who will.  I will also give patient clonidine 0.1 mg to take 3 times daily for 1 week, then twice daily for 1 week, then once a day for 1 week to help patient wean down and get off of the opioids.  Referral to chemical dependency/addiction program placed.  Patient aware that if any issues arise or he has any symptoms of withdrawal he can go to emergency room for evaluation treatment right away.  Follow up here in 1-2 weeks for recheck on how you are doing.   Patient aware I can no longer prescribe him Adderrall.

## 2018-05-08 ENCOUNTER — Encounter: Payer: Self-pay | Admitting: Family Medicine

## 2018-05-09 ENCOUNTER — Telehealth: Payer: Self-pay | Admitting: *Deleted

## 2018-05-09 ENCOUNTER — Encounter: Payer: Self-pay | Admitting: Family Medicine

## 2018-05-09 NOTE — Telephone Encounter (Signed)
Reason for CRM: Pt states he needs a work note that he was out on 05/08/18 and 05/09/18 and ok to return on 05/10/18. Please call when ready for pick up.

## 2018-05-09 NOTE — Telephone Encounter (Signed)
Work note in [employer]. Please print off for patient  Thanks  LG

## 2018-05-09 NOTE — Telephone Encounter (Signed)
Copied from CRM [id number]. Topic: General - Other >> May 09, 2018 12:11 PM Arlyss Gandyichardson, Taren N, NT wrote: Reason for CRM: Pt states he needs a work note that he was out on 05/08/18 and 05/09/18 and ok to return on 05/10/18. Please call when ready for pick up.

## 2018-05-10 NOTE — Telephone Encounter (Signed)
Called Pt to tell him we have a work note for him at the front office. No answer VM full will try back later.

## 2018-05-11 NOTE — Telephone Encounter (Signed)
Called Pt about work note, Pt stated he will be in today to pick up note

## 2018-05-15 ENCOUNTER — Ambulatory Visit: Payer: BLUE CROSS/BLUE SHIELD | Admitting: Gastroenterology

## 2018-05-21 ENCOUNTER — Ambulatory Visit: Payer: BLUE CROSS/BLUE SHIELD | Admitting: Family Medicine

## 2018-05-21 DIAGNOSIS — Z0289 Encounter for other administrative examinations: Secondary | ICD-10-CM

## 2018-05-21 NOTE — Progress Notes (Deleted)
   Subjective:    Patient ID: Christopher Alvarez, male    DOB: September 19, 1985, 32 y.o.   MRN: [id number]  HPI  Presents to clinic to follow up on opiate use. He was planning to go to a rehab to get self off of medication and then do out patient meetings  Patient Active Problem List   Diagnosis Date Noted  . Chronic LLQ pain 04/09/2018  . Depression, major, single episode, moderate (HCC) 09/08/2017  . Steatosis of liver 09/08/2017  . Abdominal pain 03/16/2017  . Diverticulitis of large intestine with abscess without bleeding   . Acute diverticulitis 03/07/2017  . Attention deficit hyperactivity disorder (ADHD), combined type 04/17/2015  . Chronic anxiety 02/23/2015   Social History   Tobacco Use  . Smoking status: Current Every Day Smoker    Packs/day: 1.00    Types: Cigarettes, E-cigarettes  . Smokeless tobacco: Never Used  Substance Use Topics  . Alcohol use: No    Alcohol/week: 0.0 standard drinks   Review of Systems  Constitutional: Negative for chills, fatigue and fever.  HENT: Negative for congestion, ear pain, sinus pain and sore throat.   Eyes: Negative.   Respiratory: Negative for cough, shortness of breath and wheezing.   Cardiovascular: Negative for chest pain, palpitations and leg swelling.  Gastrointestinal: Negative for abdominal pain, diarrhea, nausea and vomiting.  Genitourinary: Negative for dysuria, frequency and urgency.  Musculoskeletal: Negative for arthralgias and myalgias.  Skin: Negative for color change, pallor and rash.  Neurological: Negative for syncope, light-headedness and headaches.  Psychiatric/Behavioral: The patient is not nervous/anxious.       Objective:   Physical Exam        Assessment & Plan:

## 2018-06-14 ENCOUNTER — Ambulatory Visit: Payer: BLUE CROSS/BLUE SHIELD | Admitting: Gastroenterology

## 2018-06-29 ENCOUNTER — Telehealth (HOSPITAL_COMMUNITY): Payer: Self-pay | Admitting: Psychology

## 2019-05-06 ENCOUNTER — Emergency Department
Admission: EM | Admit: 2019-05-06 | Discharge: 2019-05-07 | Disposition: A | Discharge status: Home / Self Care | Payer: Self-pay | Attending: Emergency Medicine | Admitting: Emergency Medicine

## 2019-05-06 ENCOUNTER — Other Ambulatory Visit: Payer: Self-pay

## 2019-05-06 ENCOUNTER — Encounter: Payer: Self-pay | Admitting: Emergency Medicine

## 2019-05-06 DIAGNOSIS — F1110 Opioid abuse, uncomplicated: Secondary | ICD-10-CM | POA: Insufficient documentation

## 2019-05-06 DIAGNOSIS — F17210 Nicotine dependence, cigarettes, uncomplicated: Secondary | ICD-10-CM | POA: Insufficient documentation

## 2019-05-06 DIAGNOSIS — I10 Essential (primary) hypertension: Secondary | ICD-10-CM | POA: Insufficient documentation

## 2019-05-06 DIAGNOSIS — R110 Nausea: Secondary | ICD-10-CM | POA: Insufficient documentation

## 2019-05-06 DIAGNOSIS — T401X1A Poisoning by heroin, accidental (unintentional), initial encounter: Secondary | ICD-10-CM | POA: Insufficient documentation

## 2019-05-06 DIAGNOSIS — Z79899 Other long term (current) drug therapy: Secondary | ICD-10-CM | POA: Insufficient documentation

## 2019-05-06 DIAGNOSIS — F17290 Nicotine dependence, other tobacco product, uncomplicated: Secondary | ICD-10-CM | POA: Insufficient documentation

## 2019-05-06 DIAGNOSIS — M7918 Myalgia, other site: Secondary | ICD-10-CM | POA: Insufficient documentation

## 2019-05-06 HISTORY — DX: Opioid abuse, uncomplicated: F11.10

## 2019-05-06 LAB — BASIC METABOLIC PANEL
Anion gap: 13 (ref 5–15)
BUN: 18 mg/dL (ref 6–20)
CO2: 27 mmol/L (ref 22–32)
Calcium: 9.3 mg/dL (ref 8.9–10.3)
Chloride: 99 mmol/L (ref 98–111)
Creatinine, Ser: 1.14 mg/dL (ref 0.61–1.24)
GFR calc Af Amer: >60 mL/min (ref >60)
GFR calc non Af Amer: >60 mL/min (ref >60)
Glucose, Bld: 144 mg/dL — ABNORMAL HIGH (ref 70–99)
Potassium: 4.3 mmol/L (ref 3.5–5.1)
Sodium: 139 mmol/L (ref 135–145)

## 2019-05-06 LAB — CBC WITH DIFFERENTIAL/PLATELET
Abs Immature Granulocytes: 0.11 10*3/uL — ABNORMAL HIGH (ref 0.00–0.07)
Basophils Absolute: 0.1 10*3/uL (ref 0.0–0.1)
Basophils Relative: 0 %
Eosinophils Absolute: 0.2 10*3/uL (ref 0.0–0.5)
Eosinophils Relative: 1 %
HCT: 41.1 % (ref 39.0–52.0)
Hemoglobin: 13.6 g/dL (ref 13.0–17.0)
Immature Granulocytes: 1 %
Lymphocytes Relative: 8 %
Lymphs Abs: 1.6 10*3/uL (ref 0.7–4.0)
MCH: 29.9 pg (ref 26.0–34.0)
MCHC: 33.1 g/dL (ref 30.0–36.0)
MCV: 90.3 fL (ref 80.0–100.0)
Monocytes Absolute: 1.2 10*3/uL — ABNORMAL HIGH (ref 0.1–1.0)
Monocytes Relative: 6 %
Neutro Abs: 16.6 10*3/uL — ABNORMAL HIGH (ref 1.7–7.7)
Neutrophils Relative %: 84 %
Platelets: 222 10*3/uL (ref 150–400)
RBC: 4.55 MIL/uL (ref 4.22–5.81)
RDW: 13.1 % (ref 11.5–15.5)
WBC: 19.9 10*3/uL — ABNORMAL HIGH (ref 4.0–10.5)
nRBC: 0 % (ref 0.0–0.2)

## 2019-05-06 MED ORDER — SODIUM CHLORIDE 0.9 % IV BOLUS
1000.0000 mL | Freq: Once | INTRAVENOUS | Status: AC
  Administered 2019-05-06: 1000 mL via INTRAVENOUS

## 2019-05-06 NOTE — Discharge Instructions (Signed)
You have been seen in the Emergency Department (ED) today for an accidental heroin overdose.  You have been provided with some resources that may help you with your substance abuse, and if you have any outpatient physicians or therapists, they may also help provide additional resources.  You were also provided with a home Narcan kit.  Keep it with you and let your friends and family know you have one. If you overdose again, they can administer the medication in your nose before calling 911, but you still must come to the Emergency Department because the medication will wear off and you could stop breathing again.  Please take any prescriptions that have been provided as indicated on the instructions.  Please return to the ED immediately if you have ANY thoughts of hurting yourself or anyone else, so that we may help you.  Follow up with your doctor and/or therapist as soon as possible regarding today's ED visit.   Please follow up any other recommendations and clinic appointments provided by the psychiatry team that saw you in the Emergency Department.  Please contact [facility] for additional mental health/psychiatric assistance:  [address], [place] [address] Phone:  [phone] or [phone]  Open Access:   Walk-in ASSESSMENT hours, M-W-F, 8:00am - 3:00pm Advanced Acess CRISIS:  M-F, 8:00am - 8:00pm Outpatient Services Office Hours:  M-F, 8:00am - 5:00pm

## 2019-05-06 NOTE — ED Triage Notes (Signed)
Pt found in bathroom unresponsive by mother, fire department gave 2 mg narcan intra nasally. Pt is a heroin user, fsbs per EMS 377. Pt awake upon arrival, 18g RAC per EMS.

## 2019-05-06 NOTE — ED Provider Notes (Signed)
[facility] Emergency Department Provider Note  ____________________________________________   First MD Initiated Contact with Patient 05/06/19 2331     (approximate)  I have reviewed the triage vital signs and the nursing notes.   HISTORY  Chief Complaint Drug Overdose    HPI Christopher Alvarez is a 33 y.o. male with medical and substance abuse history as listed below who presents by EMS after an apparent heroin overdose.  He was found in his bathroom by his mother unresponsive and unclear whether or not he was breathing.  She called 911 and firefighters gave him Narcan 2 mg intranasally.  The patient woke up and became responsive at that point.  He admitted to heroin use but denies trying to kill himself.  He states that he has been on Suboxone but is about to run out so he decided to try to wean himself off of the Suboxone by using some heroin which he has not used in a number of months.  He said he thinks he just used too much and recognizes that "it was stupid".  He is awake and alert and said that his body hurts overall and he is little bit nauseated but overall his symptoms are relatively mild.  He has no shortness of breath and no chest pain.  No abdominal pain.  No recent fever or infection of which he is aware.  Onset of the symptoms was acute and his initial presentation was severe but he is currently awake and alert.        Past Medical History:  Diagnosis Date   Anxiety    Chicken pox    Depression    Diverticulitis    Diverticulitis    Drug addiction ([place])    Heroin abuse ([place])    History of concussion    Hypertension     Patient Active Problem List   Diagnosis Date Noted   Chronic LLQ pain 04/09/2018   Depression, major, single episode, moderate ([place]) 09/08/2017   Steatosis of liver 09/08/2017   Abdominal pain 03/16/2017   Diverticulitis of large intestine with abscess without bleeding    Acute diverticulitis 03/07/2017     Attention deficit hyperactivity disorder (ADHD), combined type 04/17/2015   Chronic anxiety 02/23/2015    Past Surgical History:  Procedure Laterality Date   COLECTOMY  04/24/2017   PARTIAL COLECTOMY      Prior to Admission medications   Medication Sig Start Date End Date Taking? Authorizing Provider  Cholecalciferol (VITAMIN D3) 50 MCG (2000 UT) capsule Take 1 capsule (2,000 Units total) by mouth daily. 04/11/18   Jodelle Green, FNP  cloNIDine (CATAPRES) 0.1 MG tablet Take 1 tablet 3 times per day for 1 week, then 2 times per day for 1 week, then 1 time per day for 1 week then stop 05/07/18   Jodelle Green, FNP  folic acid (FOLVITE) 983 MCG tablet Take 1 tablet (400 mcg total) by mouth daily. 04/11/18   Jodelle Green, FNP  hydrOXYzine (ATARAX/VISTARIL) 25 MG tablet Take 1 tablet (25 mg total) by mouth 3 (three) times daily as needed for anxiety. 04/09/18   Jodelle Green, FNP    Allergies Patient has no known allergies.  Family History  Problem Relation Age of Onset   Hypothyroidism Mother    Thyroid disease Mother    Depression Mother    Hypertension Mother    Thrombocytopenia Father    Other Father        Thrombocytopenia  Diabetes Father    Alcohol abuse Father    Early death Father    Diabetes Paternal Grandmother    Lung cancer Paternal Grandmother    Cancer Paternal Grandmother        breast   Dementia Paternal Grandmother    Diabetes Paternal Grandfather    Lung cancer Paternal Grandfather    Alzheimer's disease Maternal Grandfather     Social History Social History   Tobacco Use   Smoking status: Current Every Day Smoker    Packs/day: 1.00    Types: Cigarettes, E-cigarettes   Smokeless tobacco: Never Used  Substance Use Topics   Alcohol use: No    Alcohol/week: 0.0 standard drinks   Drug use: No    Comment: Recovering cocaine, benzodiazepines and opiate addiction    Review of Systems Constitutional: No fever/chills Eyes:  No visual changes. ENT: No sore throat. Cardiovascular: Denies chest pain. Respiratory: Denies shortness of breath. Gastrointestinal: No abdominal pain.  No nausea, no vomiting.  No diarrhea.  No constipation. Genitourinary: Negative for dysuria. Musculoskeletal: Negative for neck pain.  Negative for back pain. Integumentary: Negative for rash. Neurological: Negative for headaches, focal weakness or numbness. Psychiatric:  Accidental overdose without suicidal ideation  ____________________________________________   PHYSICAL EXAM:  VITAL SIGNS: ED Triage Vitals  Enc Vitals Group     BP --      Pulse Rate 05/06/19 2330 (!) 105     Resp 05/06/19 2330 20     Temp 05/06/19 2330 98.3 F (36.8 C)     Temp Source 05/06/19 2330 Oral     SpO2 05/06/19 2330 96 %     Weight 05/06/19 2328 100.2 kg (221 lb)     Height 05/06/19 2328 1.753 m (5' 9" )     Head Circumference --      Peak Flow --      Pain Score 05/06/19 2328 0     Pain Loc --      Pain Edu? --      Excl. in [place]? --     Constitutional: Alert and oriented.  No acute distress. Eyes: Conjunctivae are normal.  Head: Atraumatic. Nose: No congestion/rhinnorhea. Mouth/Throat: Patient is wearing a mask. Neck: No stridor.  No meningeal signs.   Cardiovascular: Normal rate, regular rhythm. Good peripheral circulation. Grossly normal heart sounds. Respiratory: Normal respiratory effort.  No retractions. Gastrointestinal: Soft and nontender. No distention.  Musculoskeletal: No lower extremity tenderness nor edema. No gross deformities of extremities. Neurologic:  Normal speech and language. No gross focal neurologic deficits are appreciated.  Skin:  Skin is warm, dry and intact. Psychiatric: Mood and affect are normal. Speech and behavior are normal.  Admits to heroin abuse but denies suicidal ideation, very calm and cooperative and acting appropriate.  Seems embarrassed and appears to have good insight into what happened in spite of  apparently poor judgment.  ____________________________________________   LABS (all labs ordered are listed, but only abnormal results are displayed)  Labs Reviewed  CBC WITH DIFFERENTIAL/PLATELET - Abnormal; Notable for the following components:      Result Value   WBC 19.9 (*)    Neutro Abs 16.6 (*)    Monocytes Absolute 1.2 (*)    Abs Immature Granulocytes 0.11 (*)    All other components within normal limits  BASIC METABOLIC PANEL - Abnormal; Notable for the following components:   Glucose, Bld 144 (*)    All other components within normal limits  ACETAMINOPHEN LEVEL - Abnormal; Notable for the  following components:   Acetaminophen (Tylenol), Serum <10 (*)    All other components within normal limits  SALICYLATE LEVEL   ____________________________________________  EKG  ED ECG REPORT I, Hinda Kehr, the attending physician, personally viewed and interpreted this ECG.  Date: 05/06/2019 EKG Time: 23: 30 Rate: 108 Rhythm: Mild sinus tachycardia QRS Axis: Borderline left axis deviation Intervals: normal ST/T Wave abnormalities: normal Narrative Interpretation: no evidence of acute ischemia  ____________________________________________  RADIOLOGY I, Hinda Kehr, personally viewed and evaluated these images (plain radiographs) as part of my medical decision making, as well as reviewing the written report by the radiologist.  ED MD interpretation: No indication for emergent imaging  Official radiology report(s): No results found.  ____________________________________________   PROCEDURES   Procedure(s) performed (including Critical Care):  Procedures   ____________________________________________   INITIAL IMPRESSION / MDM / [place] / ED COURSE  As part of my medical decision making, I reviewed the following data within the [place] notes reviewed and incorporated, Labs reviewed , Old chart reviewed, Notes from  prior ED visits and [place] Controlled Substance Database   Differential diagnosis includes, but is not limited to, accidental heroin overdose, intentional heroin overdose with suicidal ideation, concurrent substance abuse or ingestion.  The patient is awake, alert, and acting appropriate.  Mild tachycardia and will give 1 L normal saline.  Vital signs otherwise reassuring.  No acute complaints otherwise.  I believe him that he has not used heroin for a number of months which is why he accidentally overdosed when he used heroin tonight.  He was aware this was a bad choice.  He says that he has access to outpatient resources.  I explained that we will check some basic labs and watch him for a few hours to make sure he does not require redosing of Narcan.  When he is discharged I will send him out with a Narcan intranasal kit.  No indication for involuntary commitment or psychiatric consults or other emergent medical intervention.  In addition to checking basic labs I am also checking a salicylate level and acetaminophen level just to make sure there are no potentially fatal coingestions.  No indication to check urine tox screen.      Clinical Course as of May 06 324  Tue May 07, 2019  0315 The patient has been stable for about 4 hours in the emergency department.  He is sleeping comfortably but awakens easily to light touch and voice.  He again reiterated that he does not want to hurt himself, he "knows all about RTS", and does not want any additional help or treatment.  He has a capacity to make his own decisions and I am giving him the usual outpatient resources but I did stress to him that continued abuse of opioids will eventually result in his death and he understands the potential consequences.   [CF]  [id number] I gave my usual and customary return precautions.  He will call either his wife or his mother to come pick him up.   [CF]    Clinical Course User Index [CF] Hinda Kehr, MD      ____________________________________________  FINAL CLINICAL IMPRESSION(S) / ED DIAGNOSES  Final diagnoses:  Accidental heroin overdose, initial encounter [facility])     MEDICATIONS GIVEN DURING THIS VISIT:  Medications  naloxone (NARCAN) nasal spray 4 mg/0.1 mL (has no administration in time range)  sodium chloride 0.9 % bolus 1,000 mL (0 mLs Intravenous Stopped 05/07/19 0322)  ED Discharge Orders         Ordered    naloxone [facility]) nasal spray 4 mg/0.1 mL  Status:  Discontinued     05/07/19 0320          *Please note:  Christopher Alvarez was evaluated in Emergency Department on 05/07/2019 for the symptoms described in the history of present illness. He was evaluated in the context of the global COVID-19 pandemic, which necessitated consideration that the patient might be at risk for infection with the SARS-CoV-2 virus that causes COVID-19. Institutional protocols and algorithms that pertain to the evaluation of patients at risk for COVID-19 are in a state of rapid change based on information released by regulatory bodies including the CDC and federal and state organizations. These policies and algorithms were followed during the patient's care in the ED.  Some ED evaluations and interventions may be delayed as a result of limited staffing during the pandemic.*  Note:  This document was prepared using Dragon voice recognition software and may include unintentional dictation errors.   Hinda Kehr, MD 05/07/19 [phone]

## 2019-05-07 LAB — SALICYLATE LEVEL: Salicylate Lvl: <7 mg/dL (ref 2.8–30.0)

## 2019-05-07 LAB — ACETAMINOPHEN LEVEL: Acetaminophen (Tylenol), Serum: <10 ug/mL — ABNORMAL LOW (ref 10–30)

## 2019-05-07 MED ORDER — NALOXONE HCL 4 MG/0.1ML NA LIQD: NASAL | 1 refills | Status: DC

## 2019-05-07 MED ORDER — NALOXONE HCL 4 MG/0.1ML NA LIQD
1.0000 | Freq: Once | NASAL | Status: DC
  Filled 2019-05-07: qty 4

## 2019-05-07 MED ORDER — NALOXONE HCL 4 MG/0.1ML NA LIQD: 1.0000 | Freq: Once | NASAL | Status: DC

## 2019-05-07 NOTE — ED Notes (Signed)
Patient resting comfortably

## 2019-05-07 NOTE — ED Notes (Signed)
Patient spoke with wife

## 2019-05-07 NOTE — ED Notes (Signed)
Called [place] cab for patient, patient will wait in lobby until cab arrives. Given Narcan kit, patient aware of how to use. No further questions. Patient referred for outpatient tx

## 2019-05-07 NOTE — ED Notes (Signed)
Patient placed on 2L [place] for desat with sleeping

## 2019-05-29 IMAGING — CT CT IMAGE GUIDED DRAINAGE BY PERCUTANEOUS CATHETER
1 of 4 series · 12 of 32 positions shown, 18 images · non-contrast
Comparison: none

INDICATION: 31-year-old male with persistent diverticulitis and evidence of
small micro perforation within extraluminal air collection. Due to
the persistence of these findings over multiple studies despite
antibiotic therapy, an attempted placement of a percutaneous
drainage catheter is warranted.
TECHNIQUE: Informed written consent was obtained from the patient after a
thorough discussion of the procedural risks, benefits and
alternatives. All questions were addressed. A timeout was performed
prior to the initiation of the procedure.

[Series 2: i-spiral 5.0 b30f · axial · 0.91mm/px · z∈[-225,-99]mm · 12 of 44 slices shown, 18 images]
[im 4/44  soft-tissue]
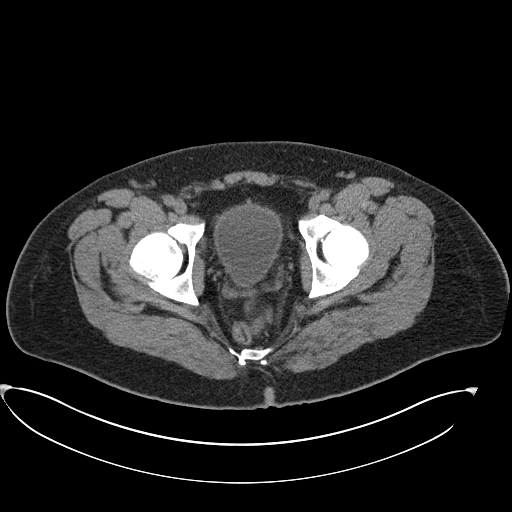
[im 4/44  bone]
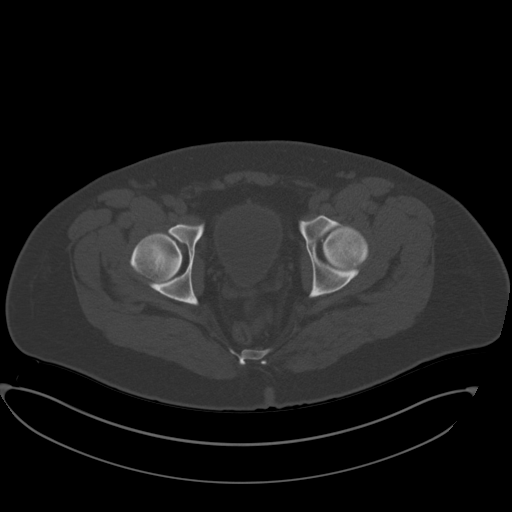
[im 7/44  soft-tissue]
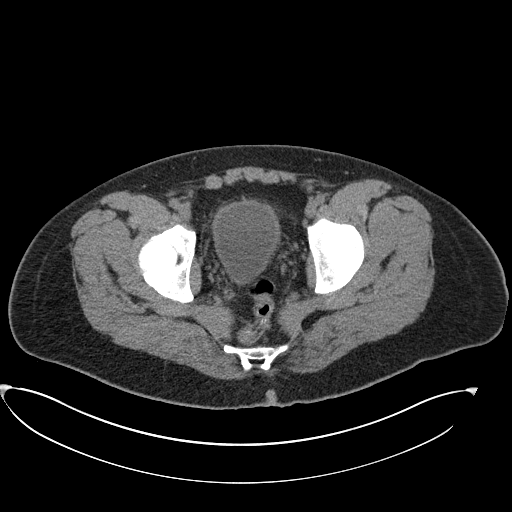
[im 10/44  soft-tissue]
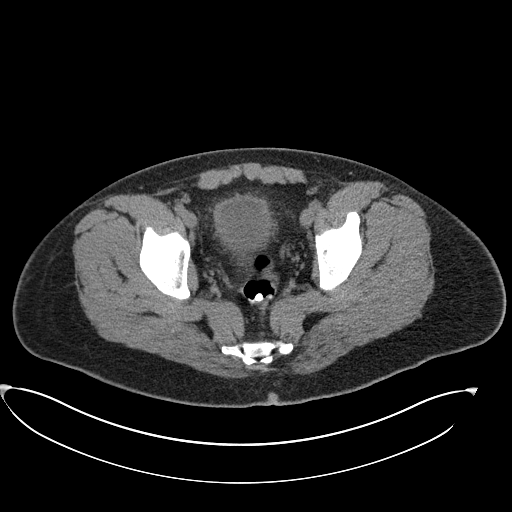
[im 14/44  soft-tissue]
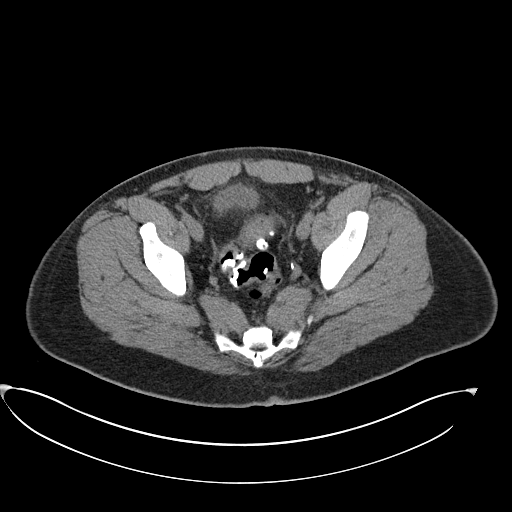
[im 17/44  soft-tissue]
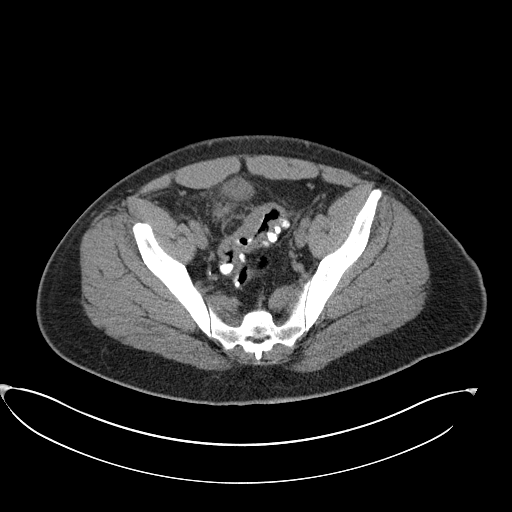
[im 20/44  soft-tissue]
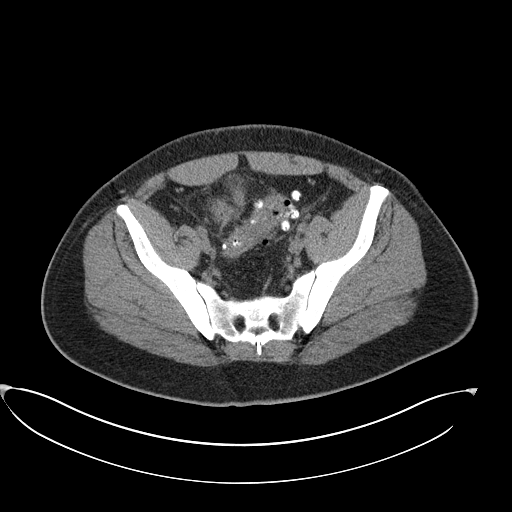
[im 24/44  soft-tissue]
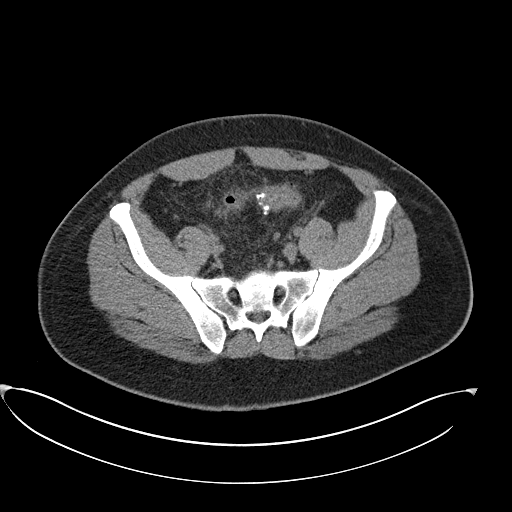
[im 27/44  soft-tissue]
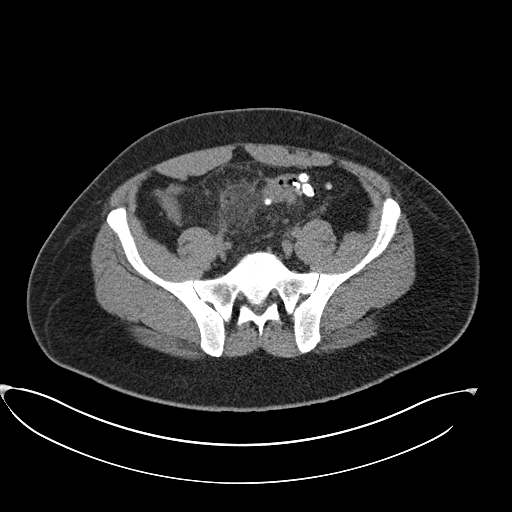
[im 30/44  soft-tissue]
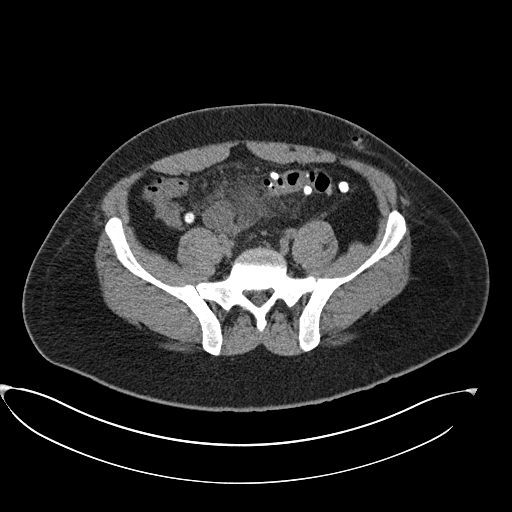
[im 30/44  lung]
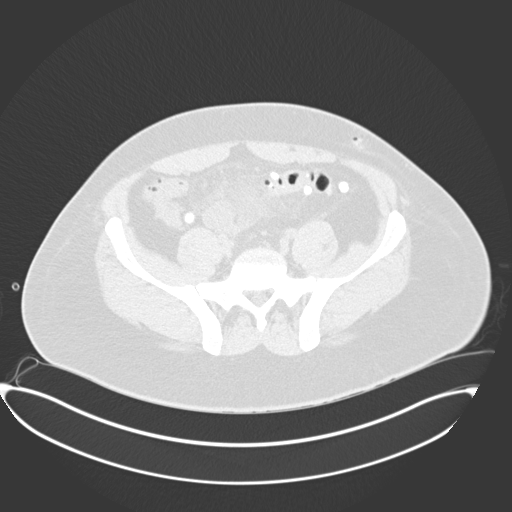
[im 30/44  bone]
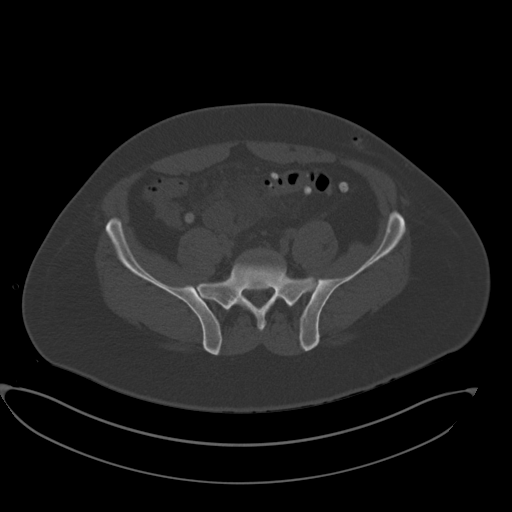
[im 34/44  soft-tissue]
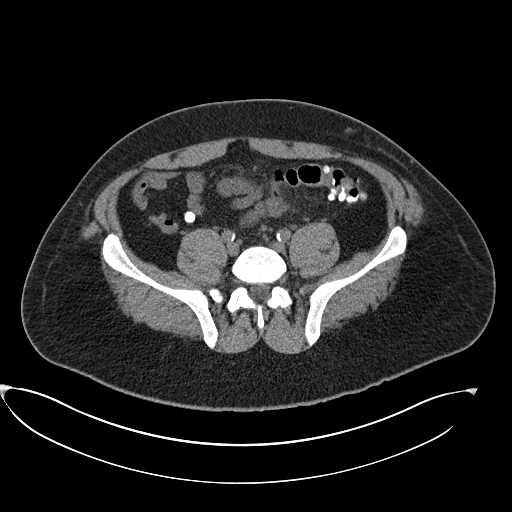
[im 34/44  lung]
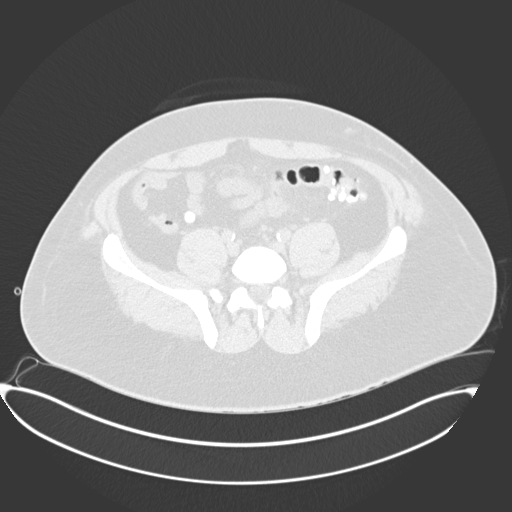
[im 37/44  soft-tissue]
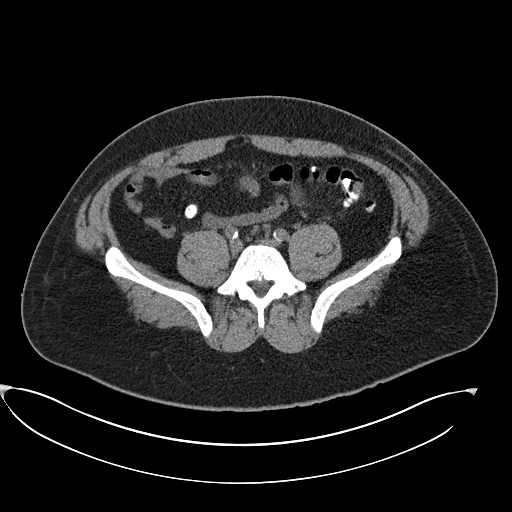
[im 37/44  lung]
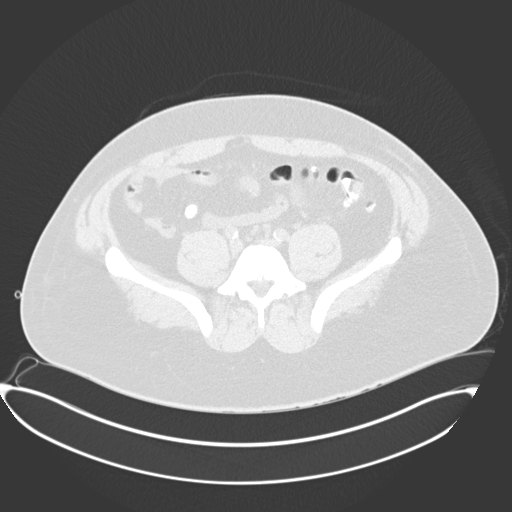
[im 40/44  soft-tissue]
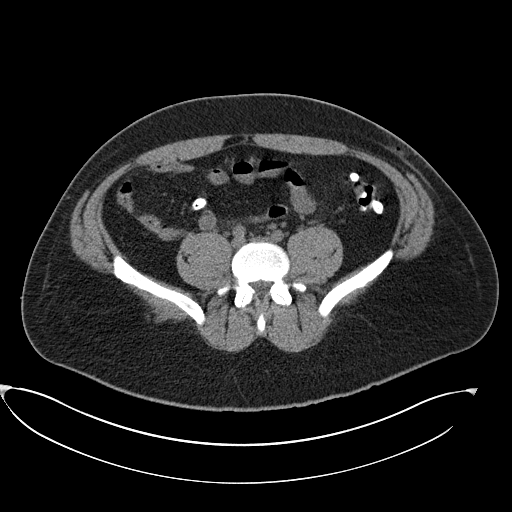
[im 40/44  lung]
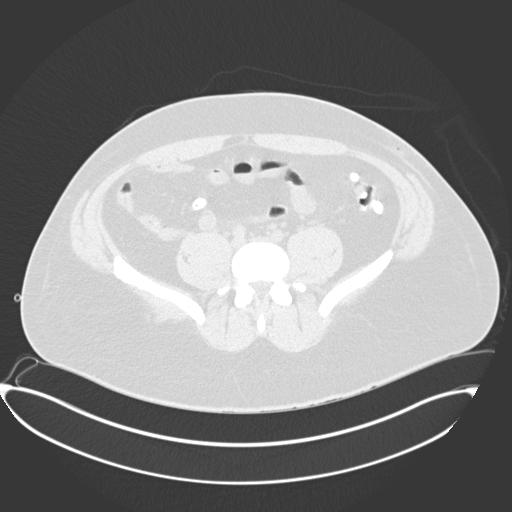

[12 of 32 positions shown; findings below may reference images not displayed]

EXAM:
CT GUIDED DRAINAGE OF  ABSCESS

MEDICATIONS:
The patient is currently admitted to the hospital and receiving
intravenous antibiotics. The antibiotics were administered within an
appropriate time frame prior to the initiation of the procedure.

ANESTHESIA/SEDATION:
5 mg IV Versed 200 mcg IV Fentanyl

Moderate Sedation Time:  30 minutes

The patient was continuously monitored during the procedure by the
interventional radiology nurse under my direct supervision.

COMPLICATIONS:
None immediate.
PROCEDURE:
A planning axial CT scan was performed. The very small gas locule
adjacent to the inflamed sigmoid colon was successfully identified.
A suitable skin entry site was selected and marked. The region was
sterilely prepped and draped in standard fashion using chlorhexidine
skin prep. Local anesthesia was attained by infiltration with 1%
lidocaine. A small dermatotomy was made. Under intermittent CT
guidance, an 18 gauge trocar needle was carefully advanced into the
gas collection. An Amplatz wire was then advanced through the
needle. CT imaging confirms that this is coiling in the region of
the gas.

Therefore, the decision was made to proceed with drain placement.
The skin tract was dilated to 10 French and a 10.2 Geoffrey Xi
Aujla drainage catheter was advanced over the wire and formed.
Aspiration yields 1 mL of bloody fluid.

Post drain placement CT imaging demonstrates the drainage catheter
in excellent position.
FINDINGS: Successful placement of a 10 French percutaneous drain into the
extraluminal gas collection adjacent to the inflamed sigmoid colon.
IMPRESSION: Successful placement of a 10 French percutaneous drain into the
extraluminal gas collection adjacent to the inflamed sigmoid colon.
